# Patient Record
Sex: Female | Born: 1960 | Race: Black or African American | Hispanic: Yes | Smoking: Never smoker
Health system: Southern US, Community
[De-identification: ages and names within clinical notes are randomized; demographics above are authoritative.]

## PROBLEM LIST (undated history)

## (undated) DIAGNOSIS — T8859XA Other complications of anesthesia, initial encounter: Secondary | ICD-10-CM

## (undated) HISTORY — PX: DILATION AND CURETTAGE OF UTERUS: SHX78

## (undated) HISTORY — PX: TONSILLECTOMY: SUR1361

## (undated) HISTORY — PX: ROTATOR CUFF REPAIR: SHX139

---

## 2019-10-11 ENCOUNTER — Other Ambulatory Visit: Payer: Self-pay | Admitting: Sports Medicine

## 2019-10-11 DIAGNOSIS — G8929 Other chronic pain: Secondary | ICD-10-CM

## 2019-10-11 DIAGNOSIS — M25561 Pain in right knee: Secondary | ICD-10-CM

## 2019-10-11 DIAGNOSIS — M2391 Unspecified internal derangement of right knee: Secondary | ICD-10-CM

## 2019-10-11 DIAGNOSIS — M76891 Other specified enthesopathies of right lower limb, excluding foot: Secondary | ICD-10-CM

## 2019-11-21 ENCOUNTER — Ambulatory Visit: Payer: 59

## 2020-01-09 ENCOUNTER — Encounter: Payer: Self-pay | Admitting: Cardiology

## 2020-01-09 ENCOUNTER — Ambulatory Visit (INDEPENDENT_AMBULATORY_CARE_PROVIDER_SITE_OTHER): Payer: 59 | Admitting: Cardiology

## 2020-01-09 ENCOUNTER — Ambulatory Visit (INDEPENDENT_AMBULATORY_CARE_PROVIDER_SITE_OTHER): Payer: 59

## 2020-01-09 ENCOUNTER — Other Ambulatory Visit: Payer: Self-pay

## 2020-01-09 VITALS — BP 112/82 | HR 63 | Ht 62.0 in | Wt 181.2 lb

## 2020-01-09 DIAGNOSIS — R42 Dizziness and giddiness: Secondary | ICD-10-CM

## 2020-01-09 DIAGNOSIS — I499 Cardiac arrhythmia, unspecified: Secondary | ICD-10-CM

## 2020-01-09 NOTE — Progress Notes (Signed)
Cardiology Office Note:    Date:  01/09/2020   ID:  Alexandra Mills, DOB [birth year], MRN [id number]  PCP:  Leanna Sato, MD  Cardiologist:  Debbe Odea, MD  Electrophysiologist:  None   Referring MD: [facility]*   Chief Complaint  Patient presents with  . New Patient (Initial Visit)    pt conern w/ bradycardia/ "feeling a pain in my heart"/ Dizziness. Meds verbally reviewed w/ pt.   Alexandra Mills is a 59 y.o. female who is being seen today for the evaluation of palpitations at the request of [facility], [facility] Darreld Mclean, MD  History of Present Illness:    Alexandra Mills is a 59 y.o. female with no significant past medical history who presents due to fluttering heart and dizziness.  Patient has noticed fluttering/rapid heart beats for some time now.  Symptoms usually last about 3 to 5 seconds.  Over the past months, symptoms seems to have worsened, now lasting over 15 seconds.  She also notes episodes of dizziness are related with heart fluttering.  Dizziness typically occur while patient is in a seated position.  Abrupt movements of her head such as bending down to tie her shoeblace do not cause or provoke symptoms.  She denies any history of heart disease.  Denies syncope.  History reviewed. No pertinent past medical history.  History reviewed. No pertinent surgical history.  Current Medications: Current Meds  Medication Sig  . acetaminophen (TYLENOL) 500 MG tablet Take 500 mg by mouth every 6 (six) hours as needed. Taking as needed  . Cholecalciferol 50 MCG (2000 UT) CAPS Take by mouth.  . Evening Primrose Oil 1000 MG CAPS Take by mouth.  Marland Kitchen ibuprofen (ADVIL) 200 MG tablet Take 200 mg by mouth every 6 (six) hours as needed. Taking as needed  . Misc Natural Products (TUMERSAID) TABS Take by mouth.  . Multiple Vitamin (MULTI-VITAMIN) tablet Take by mouth.  . Turmeric 400 MG CAPS      Allergies:   Mangifera indica, Aleve [naproxen], Codeine, and  Pineapple   Social History   Socioeconomic History  . Marital status: Single    Spouse name: Not on file  . Number of children: Not on file  . Years of education: Not on file  . Highest education level: Not on file  Occupational History  . Not on file  Tobacco Use  . Smoking status: Never Smoker  Substance and Sexual Activity  . Alcohol use: Never  . Drug use: Never  . Sexual activity: Not on file  Other Topics Concern  . Not on file  Social History Narrative  . Not on file   Social Determinants of Health   Financial Resource Strain:   . Difficulty of Paying Living Expenses: Not on file  Food Insecurity:   . Worried About Programme researcher, broadcasting/film/video in the Last Year: Not on file  . Ran Out of Food in the Last Year: Not on file  Transportation Needs:   . Lack of Transportation (Medical): Not on file  . Lack of Transportation (Non-Medical): Not on file  Physical Activity:   . Days of Exercise per Week: Not on file  . Minutes of Exercise per Session: Not on file  Stress:   . Feeling of Stress : Not on file  Social Connections:   . Frequency of Communication with Friends and Family: Not on file  . Frequency of Social Gatherings with Friends and Family: Not on file  . Attends Religious Services: Not  on file  . Active Member of Clubs or Organizations: Not on file  . Attends Banker Meetings: Not on file  . Marital Status: Not on file     Family History: The patient's family history includes Bradycardia in her mother; Heart attack in her maternal grandmother; Stroke in her maternal grandmother.  ROS:   Please see the history of present illness.     All other systems reviewed and are negative.  EKGs/Labs/Other Studies Reviewed:    The following studies were reviewed today:   EKG:  EKG is  ordered today.  The ekg ordered today demonstrates normal sinus rhythm, normal ECG.  Recent Labs: No results found for requested labs within last 8760 hours.  Recent Lipid  Panel No results found for: CHOL, TRIG, HDL, CHOLHDL, VLDL, LDLCALC, LDLDIRECT  Physical Exam:    VS:  BP 112/82 (BP Location: Right Arm, Patient Position: Sitting, Cuff Size: Normal)   Pulse 63   Ht 5\' 2"  (1.575 m)   Wt 181 lb 4 oz (82.2 kg)   SpO2 98%   BMI 33.15 kg/m     Wt Readings from Last 3 Encounters:  01/09/20 181 lb 4 oz (82.2 kg)     GEN:  Well nourished, well developed in no acute distress HEENT: Normal NECK: No JVD; No carotid bruits LYMPHATICS: No lymphadenopathy CARDIAC: RRR, no murmurs, rubs, gallops RESPIRATORY:  Clear to auscultation without rales, wheezing or rhonchi  ABDOMEN: Soft, non-tender, non-distended MUSCULOSKELETAL:  No edema; No deformity  SKIN: Warm and dry NEUROLOGIC:  Alert and oriented x 3 PSYCHIATRIC:  Normal affect   ASSESSMENT:    1. Irregular heart beat   2. Dizziness    PLAN:    In order of problems listed above:  1. Patient with symptoms of fluttering heart.  Atrial arrhythmias are more likely in the etiology.  We will evaluate with a cardiac monitor x2 weeks. 2. Her symptoms of dizziness are not positional and unrelated with fluttering heart symptoms.  Orthostatic vitals in the office today did not reveal any findings of orthostasis.  Suspect an inner ear/balance problem.  She can follow-up with PCP or ENT for further management of this.  Follow-up in 1 month after cardiac monitor.  This note was generated in part or whole with voice recognition software. Voice recognition is usually quite accurate but there are transcription errors that can and very often do occur. I apologize for any typographical errors that were not detected and corrected.  Medication Adjustments/Labs and Tests Ordered: Current medicines are reviewed at length with the patient today.  Concerns regarding medicines are outlined above.  Orders Placed This Encounter  Procedures  . LONG TERM MONITOR (3-14 DAYS)  . EKG 12-Lead   No orders of the defined types  were placed in this encounter.   Patient Instructions  Medication Instructions:  Your physician recommends that you continue on your current medications as directed. Please refer to the Current Medication list given to you today.  *If you need a refill on your cardiac medications before your next appointment, please call your pharmacy*  Lab Work: None ordered If you have labs (blood work) drawn today and your tests are completely normal, you will receive your results only by: 01/11/20 MyChart Message (if you have MyChart) OR . A paper copy in the mail If you have any lab test that is abnormal or we need to change your treatment, we will call you to review the results.  Testing/Procedures: Your physician  has recommended that you wear a zio monitor. Zio monitors are medical devices that record the heart's electrical activity. Doctors most often [place] these monitors to diagnose arrhythmias. Arrhythmias are problems with the speed or rhythm of the heartbeat. The monitor is a small, portable device. You can wear one while you do your normal daily activities. This is usually used to diagnose what is causing palpitations/syncope (passing out).    Follow-Up: At [facility], you and your health needs are our priority.  As part of our continuing mission to provide you with exceptional heart care, we have created designated Provider Care Teams.  These Care Teams include your primary Cardiologist (physician) and Advanced Practice Providers (APPs -  Physician Assistants and Nurse Practitioners) who all work together to provide you with the care you need, when you need it.  Your next appointment:   4 week(s)  The format for your next appointment:   In Person  Provider:    You may see Kate Sable, MD or one of the following Advanced Practice Providers on your designated Care Team:    Murray Hodgkins, NP  Christell Faith, PA-C  Marrianne Mood, PA-C   Other Instructions Your physician has  recommended that you wear a Zio monitor. This monitor is a medical device that records the heart's electrical activity. Doctors most often use these monitors to diagnose arrhythmias. Arrhythmias are problems with the speed or rhythm of the heartbeat. The monitor is a small device applied to your chest. You can wear one while you do your normal daily activities. While wearing this monitor if you have any symptoms to push the button and record what you felt. Once you have worn this monitor for the period of time provider prescribed (Usually 14 days), you will return the monitor device in the postage paid box. Once it is returned they will download the data collected and provide [place] with a report which the provider will then review and we will call you with those results. Important tips:  1. Avoid showering during the first 24 hours of wearing the monitor. 2. Avoid excessive sweating to help maximize wear time. 3. Do not submerge the device, no hot tubs, and no swimming pools. 4. Keep any lotions or oils away from the patch. 5. After 24 hours you may shower with the patch on. Take brief showers with your back facing the shower head.  6. Do not remove patch once it has been placed because that will interrupt data and decrease adhesive wear time. 7. Push the button when you have any symptoms and write down what you were feeling. 8. Once you have completed wearing your monitor, remove and place into box which has postage paid and place in your outgoing mailbox.  9. If for some reason you have misplaced your box then call our office and we can provide another box and/or mail it off for you.           Signed, Kate Sable, MD  01/09/2020 10:39 AM    [place]

## 2020-01-09 NOTE — Patient Instructions (Signed)
Medication Instructions:  Your physician recommends that you continue on your current medications as directed. Please refer to the Current Medication list given to you today.  *If you need a refill on your cardiac medications before your next appointment, please call your pharmacy*  Lab Work: None ordered If you have labs (blood work) drawn today and your tests are completely normal, you will receive your results only by: Marland Kitchen MyChart Message (if you have MyChart) OR . A paper copy in the mail If you have any lab test that is abnormal or we need to change your treatment, we will call you to review the results.  Testing/Procedures: Your physician has recommended that you wear a zio monitor. Zio monitors are medical devices that record the heart's electrical activity. Doctors most often [place] these monitors to diagnose arrhythmias. Arrhythmias are problems with the speed or rhythm of the heartbeat. The monitor is a small, portable device. You can wear one while you do your normal daily activities. This is usually used to diagnose what is causing palpitations/syncope (passing out).    Follow-Up: At [facility], you and your health needs are our priority.  As part of our continuing mission to provide you with exceptional heart care, we have created designated Provider Care Teams.  These Care Teams include your primary Cardiologist (physician) and Advanced Practice Providers (APPs -  Physician Assistants and Nurse Practitioners) who all work together to provide you with the care you need, when you need it.  Your next appointment:   4 week(s)  The format for your next appointment:   In Person  Provider:    You may see Debbe Odea, MD or one of the following Advanced Practice Providers on your designated Care Team:    Nicolasa Ducking, NP  Eula Listen, PA-C  Marisue Ivan, PA-C   Other Instructions Your physician has recommended that you wear a Zio monitor. This monitor is a  medical device that records the heart's electrical activity. Doctors most often use these monitors to diagnose arrhythmias. Arrhythmias are problems with the speed or rhythm of the heartbeat. The monitor is a small device applied to your chest. You can wear one while you do your normal daily activities. While wearing this monitor if you have any symptoms to push the button and record what you felt. Once you have worn this monitor for the period of time provider prescribed (Usually 14 days), you will return the monitor device in the postage paid box. Once it is returned they will download the data collected and provide [place] with a report which the provider will then review and we will call you with those results. Important tips:  1. Avoid showering during the first 24 hours of wearing the monitor. 2. Avoid excessive sweating to help maximize wear time. 3. Do not submerge the device, no hot tubs, and no swimming pools. 4. Keep any lotions or oils away from the patch. 5. After 24 hours you may shower with the patch on. Take brief showers with your back facing the shower head.  6. Do not remove patch once it has been placed because that will interrupt data and decrease adhesive wear time. 7. Push the button when you have any symptoms and write down what you were feeling. 8. Once you have completed wearing your monitor, remove and place into box which has postage paid and place in your outgoing mailbox.  9. If for some reason you have misplaced your box then call our office and we  can provide another box and/or mail it off for you.        

## 2020-02-06 ENCOUNTER — Ambulatory Visit: Payer: 59 | Attending: Internal Medicine

## 2020-02-06 DIAGNOSIS — Z23 Encounter for immunization: Secondary | ICD-10-CM

## 2020-02-06 NOTE — Progress Notes (Signed)
   Covid-19 Vaccination Clinic  Name:  Alexandra Mills    MRN: [id number] DOB: 1960-12-08  02/06/2020  [name] was observed post Covid-19 immunization for 30 minutes based on pre-vaccination screening without incident. She was provided with Vaccine Information Sheet and instruction to access the V-Safe system.   [name] was instructed to call 911 with any severe reactions post vaccine: Marland Kitchen Difficulty breathing  . Swelling of face and throat  . A fast heartbeat  . A bad rash all over body  . Dizziness and weakness   Immunizations Administered    Name Date Dose VIS Date Route   Pfizer COVID-19 Vaccine 02/06/2020 10:03 AM 0.3 mL 10/28/2019 Intramuscular   Manufacturer: [employer], [employer]   Lot: [id number]   NDC: [address]-1

## 2020-02-16 ENCOUNTER — Other Ambulatory Visit: Payer: Self-pay

## 2020-02-16 ENCOUNTER — Ambulatory Visit (INDEPENDENT_AMBULATORY_CARE_PROVIDER_SITE_OTHER): Payer: 59 | Admitting: Cardiology

## 2020-02-16 ENCOUNTER — Encounter: Payer: Self-pay | Admitting: Cardiology

## 2020-02-16 VITALS — BP 120/80 | HR 70 | Ht 62.5 in | Wt 183.1 lb

## 2020-02-16 DIAGNOSIS — I471 Supraventricular tachycardia: Secondary | ICD-10-CM | POA: Diagnosis not present

## 2020-02-16 DIAGNOSIS — I499 Cardiac arrhythmia, unspecified: Secondary | ICD-10-CM

## 2020-02-16 DIAGNOSIS — I491 Atrial premature depolarization: Secondary | ICD-10-CM

## 2020-02-16 NOTE — Patient Instructions (Signed)
Medication Instructions:  Your physician recommends that you continue on your current medications as directed. Please refer to the Current Medication list given to you today.  *If you need a refill on your cardiac medications before your next appointment, please call your pharmacy*   Lab Work: none If you have labs (blood work) drawn today and your tests are completely normal, you will receive your results only by: Marland Kitchen MyChart Message (if you have MyChart) OR . A paper copy in the mail If you have any lab test that is abnormal or we need to change your treatment, we will call you to review the results.   Testing/Procedures: none   Follow-Up: At [facility], you and your health needs are our priority.  As part of our continuing mission to provide you with exceptional heart care, we have created designated Provider Care Teams.  These Care Teams include your primary Cardiologist (physician) and Advanced Practice Providers (APPs -  Physician Assistants and Nurse Practitioners) who all work together to provide you with the care you need, when you need it.  We recommend signing up for the patient portal called "MyChart".  Sign up information is provided on this After Visit Summary.  MyChart is used to connect with patients for Virtual Visits (Telemedicine).  Patients are able to view lab/test results, encounter notes, upcoming appointments, etc.  Non-urgent messages can be sent to your provider as well.   To learn more about what you can do with MyChart, go to [email or web address].    Your next appointment:   6 month(s)  The format for your next appointment:   In Person  Provider:    You may see Debbe Odea, MD or one of the following Advanced Practice Providers on your designated Care Team:    Nicolasa Ducking, NP  Eula Listen, PA-C  Marisue Ivan, PA-C

## 2020-02-16 NOTE — Progress Notes (Signed)
Cardiology Office Note:    Date:  02/16/2020   ID:  Alexandra Mills, DOB [birth year], MRN [id number]  PCP:  Leanna Sato, MD  Cardiologist:  Debbe Odea, MD  Electrophysiologist:  None   Referring MD: Leanna Sato, MD   Chief Complaint  Patient presents with  . other    4 week follow up & discuss Zio report. Meds reviewed by the pt. verbally. Pt. c/o palpitations and chest pain at times.      History of Present Illness:    Alexandra Mills is a 59 y.o. female with no significant past medical history who presents for follow-up.  She was last seen due to worsening heart fluttering typically lasting 3 to 5 seconds and associated with dizziness.  A cardiac monitor was obtained to evaluate any significant arrhythmias.  Dizziness was not deemed to be secondary to cardiac etiology.  Last orthostatic vital signs do not reveal any evidence for orthostasis.   History reviewed. No pertinent past medical history.  History reviewed. No pertinent surgical history.  Current Medications: Current Meds  Medication Sig  . acetaminophen (TYLENOL) 500 MG tablet Take 500 mg by mouth every 6 (six) hours as needed. Taking as needed  . Cholecalciferol 50 MCG (2000 UT) CAPS Take by mouth.  . Evening Primrose Oil 1000 MG CAPS Take by mouth.  Marland Kitchen ibuprofen (ADVIL) 200 MG tablet Take 200 mg by mouth every 6 (six) hours as needed. Taking as needed  . Misc Natural Products (TUMERSAID) TABS Take by mouth.  . Multiple Vitamin (MULTI-VITAMIN) tablet Take by mouth.  . Turmeric 400 MG CAPS      Allergies:   Mangifera indica, Aleve [naproxen], Codeine, and Pineapple   Social History   Socioeconomic History  . Marital status: Single    Spouse name: Not on file  . Number of children: Not on file  . Years of education: Not on file  . Highest education level: Not on file  Occupational History  . Not on file  Tobacco Use  . Smoking status: Never Smoker  . Smokeless tobacco: Never Used  Substance and  Sexual Activity  . Alcohol use: Never  . Drug use: Never  . Sexual activity: Not on file  Other Topics Concern  . Not on file  Social History Narrative  . Not on file   Social Determinants of Health   Financial Resource Strain:   . Difficulty of Paying Living Expenses:   Food Insecurity:   . Worried About Programme researcher, broadcasting/film/video in the Last Year:   . Barista in the Last Year:   Transportation Needs:   . Freight forwarder (Medical):   Marland Kitchen Lack of Transportation (Non-Medical):   Physical Activity:   . Days of Exercise per Week:   . Minutes of Exercise per Session:   Stress:   . Feeling of Stress :   Social Connections:   . Frequency of Communication with Friends and Family:   . Frequency of Social Gatherings with Friends and Family:   . Attends Religious Services:   . Active Member of Clubs or Organizations:   . Attends Banker Meetings:   Marland Kitchen Marital Status:      Family History: The patient's family history includes Bradycardia in her mother; Heart attack in her maternal grandmother; Stroke in her maternal grandmother.  ROS:   Please see the history of present illness.     All other systems reviewed and are negative.  EKGs/Labs/Other Studies  Reviewed:    The following studies were reviewed today:   EKG:  EKG is  ordered today.  The ekg ordered today demonstrates normal sinus rhythm, normal ECG.  Recent Labs: No results found for requested labs within last 8760 hours.  Recent Lipid Panel No results found for: CHOL, TRIG, HDL, CHOLHDL, VLDL, LDLCALC, LDLDIRECT  Physical Exam:    VS:  BP 120/80 (BP Location: Left Arm, Patient Position: Sitting, Cuff Size: Normal)   Pulse 70   Ht 5' 2.5" (1.588 m)   Wt 183 lb 2 oz (83.1 kg)   SpO2 98%   BMI 32.96 kg/m     Wt Readings from Last 3 Encounters:  02/16/20 183 lb 2 oz (83.1 kg)  01/09/20 181 lb 4 oz (82.2 kg)     GEN:  Well nourished, well developed in no acute distress HEENT: Normal NECK:  No JVD; No carotid bruits LYMPHATICS: No lymphadenopathy CARDIAC: RRR, no murmurs, rubs, gallops RESPIRATORY:  Clear to auscultation without rales, wheezing or rhonchi  ABDOMEN: Soft, non-tender, non-distended MUSCULOSKELETAL:  No edema; No deformity  SKIN: Warm and dry NEUROLOGIC:  Alert and oriented x 3 PSYCHIATRIC:  Normal affect   ASSESSMENT:    1. Paroxysmal SVT (supraventricular tachycardia) ([place])   2. Irregular heart beat   3. PAC (premature atrial contraction)    PLAN:     Patient with history of irregular heartbeat/fluttering heart.  2-week cardiac monitor showed frequent PACs,  episodes of nonsustained SVT runs of about 4 beats associated with patient triggered events.  No other significant arrhythmias were noted.  Patient made aware and trial of beta-blocker discussed with patient.  Patient will like to wait and see sayings she does not have any worrisome symptoms of dizziness and occasional skipped beats are not really bothersome.  If symptoms get worse we will consider a trial of beta-blockers.   Follow-up in 6 months  This note was generated in part or whole with voice recognition software. Voice recognition is usually quite accurate but there are transcription errors that can and very often do occur. I apologize for any typographical errors that were not detected and corrected.  Medication Adjustments/Labs and Tests Ordered: Current medicines are reviewed at length with the patient today.  Concerns regarding medicines are outlined above.  Orders Placed This Encounter  Procedures  . EKG 12-Lead   No orders of the defined types were placed in this encounter.   Patient Instructions  Medication Instructions:  Your physician recommends that you continue on your current medications as directed. Please refer to the Current Medication list given to you today.  *If you need a refill on your cardiac medications before your next appointment, please call your  pharmacy*   Lab Work: none If you have labs (blood work) drawn today and your tests are completely normal, you will receive your results only by: Marland Kitchen MyChart Message (if you have MyChart) OR . A paper copy in the mail If you have any lab test that is abnormal or we need to change your treatment, we will call you to review the results.   Testing/Procedures: none   Follow-Up: At [facility], you and your health needs are our priority.  As part of our continuing mission to provide you with exceptional heart care, we have created designated Provider Care Teams.  These Care Teams include your primary Cardiologist (physician) and Advanced Practice Providers (APPs -  Physician Assistants and Nurse Practitioners) who all work together to provide you with the  care you need, when you need it.  We recommend signing up for the patient portal called "MyChart".  Sign up information is provided on this After Visit Summary.  MyChart is used to connect with patients for Virtual Visits (Telemedicine).  Patients are able to view lab/test results, encounter notes, upcoming appointments, etc.  Non-urgent messages can be sent to your provider as well.   To learn more about what you can do with MyChart, go to [email or web address].    Your next appointment:   6 month(s)  The format for your next appointment:   In Person  Provider:    You may see Debbe Odea, MD or one of the following Advanced Practice Providers on your designated Care Team:    Nicolasa Ducking, NP  Eula Listen, PA-C  Marisue Ivan, PA-C     Signed, Debbe Odea, MD  02/16/2020 12:18 PM    [facility]

## 2020-02-27 ENCOUNTER — Ambulatory Visit: Payer: 59 | Attending: Internal Medicine

## 2020-02-27 DIAGNOSIS — Z23 Encounter for immunization: Secondary | ICD-10-CM

## 2020-02-27 NOTE — Progress Notes (Signed)
   Covid-19 Vaccination Clinic  Name:  Floyce Bujak    MRN: [id number] DOB: Feb 11, 1961  02/27/2020  Ms. Camire was observed post Covid-19 immunization for 30 minutes based on pre-vaccination screening without incident. She was provided with Vaccine Information Sheet and instruction to access the V-Safe system.   Ms. Zamor was instructed to call 911 with any severe reactions post vaccine: Marland Kitchen Difficulty breathing  . Swelling of face and throat  . A fast heartbeat  . A bad rash all over body  . Dizziness and weakness   Immunizations Administered    Name Date Dose VIS Date Route   Pfizer COVID-19 Vaccine 02/27/2020  9:03 AM 0.3 mL 10/28/2019 Intramuscular   Manufacturer: [employer], [employer]   Lot: [id number]   NDC: [address]-4

## 2020-07-30 ENCOUNTER — Other Ambulatory Visit: Payer: Self-pay | Admitting: Physician Assistant

## 2020-07-30 DIAGNOSIS — R439 Unspecified disturbances of smell and taste: Secondary | ICD-10-CM

## 2020-07-30 DIAGNOSIS — G8929 Other chronic pain: Secondary | ICD-10-CM

## 2020-08-16 ENCOUNTER — Ambulatory Visit: Payer: 59

## 2020-08-17 ENCOUNTER — Ambulatory Visit: Payer: 59 | Admitting: Cardiology

## 2020-09-06 ENCOUNTER — Ambulatory Visit: Payer: 59 | Admitting: Cardiology

## 2020-09-06 ENCOUNTER — Encounter: Payer: Self-pay | Admitting: Cardiology

## 2020-09-06 ENCOUNTER — Other Ambulatory Visit: Payer: Self-pay

## 2020-09-06 VITALS — BP 110/76 | HR 69 | Ht 62.0 in | Wt 180.4 lb

## 2020-09-06 DIAGNOSIS — I471 Supraventricular tachycardia: Secondary | ICD-10-CM

## 2020-09-06 NOTE — Progress Notes (Signed)
Cardiology Office Note:    Date:  09/06/2020   ID:  [name], DOB 1961-03-16, MRN [id number]  PCP:  Leanna Sato, MD  Cardiologist:  Debbe Odea, MD  Electrophysiologist:  None   Referring MD: Leanna Sato, MD   Chief Complaint  Patient presents with  . OTHER    6 month f/u no complaints today. Meds reviewed verbally with pt.     History of Present Illness:    Alexandra Mills is a 59 y.o. female with history of paroxysmal SVT who presents for follow-up.  Has a history of fluttering and dizziness.  Cardiac monitor showed paroxysmal SVT.  Beta-blocker trial was discussed with patient but she wanted to monitor symptoms off medications.  She has rare symptoms of palpitations maybe once a month, does not affect her activities of daily living.  She feels well otherwise, exercises frequently without any adverse effects.   History reviewed. No pertinent past medical history.  History reviewed. No pertinent surgical history.  Current Medications: Current Meds  Medication Sig  . acetaminophen (TYLENOL) 500 MG tablet Take 500 mg by mouth every 6 (six) hours as needed. Taking as needed  . Ascorbic Acid (VITAMIN C PO) Take by mouth daily.  . B Complex-C (SUPER B-C PO) Take by mouth daily.  . Cholecalciferol 50 MCG (2000 UT) CAPS Take by mouth.  . Evening Primrose Oil 1000 MG CAPS Take by mouth.  Marland Kitchen ibuprofen (ADVIL) 200 MG tablet Take 200 mg by mouth every 6 (six) hours as needed. Taking as needed  . Misc Natural Products (TUMERSAID) TABS Take by mouth.  . Multiple Vitamin (MULTI-VITAMIN) tablet Take by mouth.  . Turmeric 400 MG CAPS      Allergies:   Mangifera indica, Aleve [naproxen], Codeine, and Pineapple   Social History   Socioeconomic History  . Marital status: Single    Spouse name: Not on file  . Number of children: Not on file  . Years of education: Not on file  . Highest education level: Not on file  Occupational History  . Not on file  Tobacco Use  .  Smoking status: Never Smoker  . Smokeless tobacco: Never Used  Vaping Use  . Vaping Use: Never used  Substance and Sexual Activity  . Alcohol use: Never  . Drug use: Never  . Sexual activity: Not on file  Other Topics Concern  . Not on file  Social History Narrative  . Not on file   Social Determinants of Health   Financial Resource Strain:   . Difficulty of Paying Living Expenses: Not on file  Food Insecurity:   . Worried About Programme researcher, broadcasting/film/video in the Last Year: Not on file  . Ran Out of Food in the Last Year: Not on file  Transportation Needs:   . Lack of Transportation (Medical): Not on file  . Lack of Transportation (Non-Medical): Not on file  Physical Activity:   . Days of Exercise per Week: Not on file  . Minutes of Exercise per Session: Not on file  Stress:   . Feeling of Stress : Not on file  Social Connections:   . Frequency of Communication with Friends and Family: Not on file  . Frequency of Social Gatherings with Friends and Family: Not on file  . Attends Religious Services: Not on file  . Active Member of Clubs or Organizations: Not on file  . Attends Banker Meetings: Not on file  . Marital Status: Not on file  Family History: The patient's family history includes Bradycardia in her mother; Heart attack in her maternal grandmother; Stroke in her maternal grandmother.  ROS:   Please see the history of present illness.     All other systems reviewed and are negative.  EKGs/Labs/Other Studies Reviewed:    The following studies were reviewed today:   EKG:  EKG is  ordered today.  The ekg ordered today demonstrates normal sinus rhythm, sinus arrhythmia.  Recent Labs: No results found for requested labs within last 8760 hours.  Recent Lipid Panel No results found for: CHOL, TRIG, HDL, CHOLHDL, VLDL, LDLCALC, LDLDIRECT  Physical Exam:    VS:  BP 110/76 (BP Location: Left Arm, Patient Position: Sitting, Cuff Size: Normal)   Pulse 69    Ht 5\' 2"  (1.575 m)   Wt 180 lb 6 oz (81.8 kg)   SpO2 98%   BMI 32.99 kg/m     Wt Readings from Last 3 Encounters:  09/06/20 180 lb 6 oz (81.8 kg)  02/16/20 183 lb 2 oz (83.1 kg)  01/09/20 181 lb 4 oz (82.2 kg)     GEN:  Well nourished, well developed in no acute distress HEENT: Normal NECK: No JVD; No carotid bruits LYMPHATICS: No lymphadenopathy CARDIAC: RRR, no murmurs, rubs, gallops RESPIRATORY:  Clear to auscultation without rales, wheezing or rhonchi  ABDOMEN: Soft, non-tender, non-distended MUSCULOSKELETAL:  No edema; No deformity  SKIN: Warm and dry NEUROLOGIC:  Alert and oriented x 3 PSYCHIATRIC:  Normal affect   ASSESSMENT:    1. Paroxysmal SVT (supraventricular tachycardia) (HCC)    PLAN:     Patient with history of irregular heartbeat/fluttering heart.  Cardiac monitor showed occasional rare nonsustained SVT.  She has rare episodes of palpitations about once a month, no dizziness, no syncope.  Otherwise feels well.  Patient advised to monitor symptoms.  Will let 01/11/20 know if symptoms become more frequent or worsen.  Will consider trial of beta-blockers at that point.  Follow-up as needed.  This note was generated in part or whole with voice recognition software. Voice recognition is usually quite accurate but there are transcription errors that can and very often do occur. I apologize for any typographical errors that were not detected and corrected.  Medication Adjustments/Labs and Tests Ordered: Current medicines are reviewed at length with the patient today.  Concerns regarding medicines are outlined above.  Orders Placed This Encounter  Procedures  . EKG 12-Lead   No orders of the defined types were placed in this encounter.   Patient Instructions  Medication Instructions:  Your physician recommends that you continue on your current medications as directed. Please refer to the Current Medication list given to you today.  *If you need a refill on your  cardiac medications before your next appointment, please call your pharmacy*  Follow-Up: At [facility], you and your health needs are our priority.  As part of our continuing mission to provide you with exceptional heart care, we have created designated Provider Care Teams.  These Care Teams include your primary Cardiologist (physician) and Advanced Practice Providers (APPs -  Physician Assistants and Nurse Practitioners) who all work together to provide you with the care you need, when you need it.  We recommend signing up for the patient portal called "MyChart".  Sign up information is provided on this After Visit Summary.  MyChart is used to connect with patients for Virtual Visits (Telemedicine).  Patients are able to view lab/test results, encounter notes, upcoming appointments, etc.  Non-urgent messages can be sent to your provider as well.   To learn more about what you can do with MyChart, go to [email or web address].    Your next appointment:   As needed.      Signed, Debbe Odea, MD  09/06/2020 12:57 PM    [facility]

## 2020-09-06 NOTE — Patient Instructions (Signed)

## 2020-11-30 ENCOUNTER — Other Ambulatory Visit: Payer: Self-pay | Admitting: Student

## 2020-11-30 DIAGNOSIS — M25561 Pain in right knee: Secondary | ICD-10-CM

## 2020-11-30 DIAGNOSIS — M2391 Unspecified internal derangement of right knee: Secondary | ICD-10-CM

## 2020-12-04 ENCOUNTER — Other Ambulatory Visit: Payer: Self-pay | Admitting: Student

## 2020-12-04 DIAGNOSIS — M7582 Other shoulder lesions, left shoulder: Secondary | ICD-10-CM

## 2020-12-04 DIAGNOSIS — M7522 Bicipital tendinitis, left shoulder: Secondary | ICD-10-CM

## 2020-12-08 ENCOUNTER — Ambulatory Visit: Payer: 59

## 2020-12-11 ENCOUNTER — Ambulatory Visit
Admission: RE | Admit: 2020-12-11 | Discharge: 2020-12-11 | Disposition: A | Discharge status: Home / Self Care | Payer: 59 | Source: Ambulatory Visit | Attending: Student | Admitting: Student

## 2020-12-11 ENCOUNTER — Other Ambulatory Visit: Payer: Self-pay

## 2020-12-11 DIAGNOSIS — M7582 Other shoulder lesions, left shoulder: Secondary | ICD-10-CM | POA: Diagnosis present

## 2020-12-11 DIAGNOSIS — M7522 Bicipital tendinitis, left shoulder: Secondary | ICD-10-CM | POA: Diagnosis present

## 2021-02-15 ENCOUNTER — Other Ambulatory Visit: Payer: Self-pay | Admitting: Student

## 2021-02-15 DIAGNOSIS — M2391 Unspecified internal derangement of right knee: Secondary | ICD-10-CM

## 2021-02-15 DIAGNOSIS — M25561 Pain in right knee: Secondary | ICD-10-CM

## 2021-02-22 ENCOUNTER — Ambulatory Visit
Admission: RE | Admit: 2021-02-22 | Discharge: 2021-02-22 | Disposition: A | Discharge status: Home / Self Care | Payer: 59 | Source: Ambulatory Visit | Attending: Student | Admitting: Student

## 2021-02-22 ENCOUNTER — Other Ambulatory Visit: Payer: Self-pay

## 2021-02-22 DIAGNOSIS — M25561 Pain in right knee: Secondary | ICD-10-CM | POA: Insufficient documentation

## 2021-02-22 DIAGNOSIS — M2391 Unspecified internal derangement of right knee: Secondary | ICD-10-CM | POA: Diagnosis present

## 2021-03-08 ENCOUNTER — Other Ambulatory Visit: Payer: Self-pay | Admitting: Surgery

## 2021-03-13 ENCOUNTER — Encounter
Admission: RE | Admit: 2021-03-13 | Discharge: 2021-03-13 | Disposition: A | Discharge status: Home / Self Care | Payer: 59 | Source: Ambulatory Visit | Attending: Surgery | Admitting: Surgery

## 2021-03-13 ENCOUNTER — Other Ambulatory Visit: Payer: Self-pay

## 2021-03-13 HISTORY — DX: Other complications of anesthesia, initial encounter: T88.59XA

## 2021-03-13 NOTE — Patient Instructions (Signed)
Your procedure is scheduled on: Mar 19, 2021 Tuesday  Report to the Registration Desk on the 1st floor of the [facility]. To find out your arrival time, please call [phone] between 1PM - 3PM on: Mar 18, 2021 MONDAY  REMEMBER: Instructions that are not followed completely may result in serious medical risk, up to and including death; or upon the discretion of your surgeon and anesthesiologist your surgery may need to be rescheduled.  Do not eat food after midnight the night before surgery.  No gum chewing, lozengers or hard candies.  You may however, drink CLEAR liquids up to 2 hours before you are scheduled to arrive for your surgery. Do not drink anything within 2 hours of your scheduled arrival time.  Clear liquids include: - water  - apple juice without pulp - gatorade (not RED, PURPLE, OR BLUE) - black coffee or tea (Do NOT add milk or creamers to the coffee or tea) Do NOT drink anything that is not on this list.  Type 1 and Type 2 diabetics should only drink water.  In addition, your doctor has ordered for you to drink the provided  Ensure Pre-Surgery Clear Carbohydrate Drink  Drinking this carbohydrate drink up to two hours before surgery helps to reduce insulin resistance and improve patient outcomes. Please complete drinking 2 hours prior to scheduled arrival time.  TAKE THESE MEDICATIONS THE MORNING OF SURGERY WITH A SIP OF WATER: NONE  One week prior to surgery: Stop Anti-inflammatories (NSAIDS) such as Advil, Aleve, Ibuprofen, Motrin, Naproxen, Naprosyn and ASPIRIN OR Aspirin based products such as Excedrin, Goodys Powder, BC Powder.  USE TYLENOL IF NEEDED FOR PAIN  Stop ANY OVER THE COUNTER supplements until after surgery.  No Alcohol for 24 hours before or after surgery.  No Smoking including e-cigarettes for 24 hours prior to surgery.  No chewable tobacco products for at least 6 hours prior to surgery.  No nicotine patches on the day of surgery.  Do not use  any "recreational" drugs for at least a week prior to your surgery.  Please be advised that the combination of cocaine and anesthesia may have negative outcomes, up to and including death. If you test positive for cocaine, your surgery will be cancelled.  On the morning of surgery brush your teeth with toothpaste and water, you may rinse your mouth with mouthwash if you wish. Do not swallow any toothpaste or mouthwash.  Do not wear jewelry, make-up, hairpins, clips or nail polish.  Do not wear lotions, powders, or perfumes.   Do not shave body from the neck down 48 hours prior to surgery just in case you cut yourself which could leave a site for infection.  Also, freshly shaved skin may become irritated if using the CHG soap.  Contact lenses, hearing aids and dentures may not be worn into surgery.  Do not bring valuables to the hospital. [facility] is not responsible for any missing/lost belongings or valuables.   Use CHG Soap as directed on instruction sheet.  Notify your doctor if there is any change in your medical condition (cold, fever, infection).  Wear comfortable clothing (specific to your surgery type) to the hospital.  Plan for stool softeners for home use; pain medications have a tendency to cause constipation. You can also help prevent constipation by eating foods high in fiber such as fruits and vegetables and drinking plenty of fluids as your diet allows.  After surgery, you can help prevent lung complications by doing breathing exercises.  Take deep breaths and cough every 1-2 hours. Your doctor may order a device called an Incentive Spirometer to help you take deep breaths. When coughing or sneezing, hold a pillow firmly against your incision with both hands. This is called "splinting." Doing this helps protect your incision. It also decreases belly discomfort.  If you are being discharged the day of surgery, you will not be allowed to drive home. You will need a  responsible adult (18 years or older) to drive you home and stay with you that night.   If you are taking public transportation, you will need to have a responsible adult (18 years or older) with you. Please confirm with your physician that it is acceptable to use public transportation.   Please call the Pre-admissions Testing Dept. at [phone] if you have any questions about these instructions.  Surgery Visitation Policy:  Patients undergoing a surgery or procedure may have one OR TWO family member or support person with them as long as that person is not COVID-19 positive or experiencing its symptoms.  That person may remain in the waiting area during the procedure.  Inpatient Visitation:    Visiting hours are 7 a.m. to 8 p.m. Inpatients will be allowed two visitors daily. The visitors may change each day during the patient's stay. No visitors under the age of 35. Any visitor under the age of 22 must be accompanied by an adult. The visitor must pass COVID-19 screenings, use hand sanitizer when entering and exiting the patient's room and wear a mask at all times, including in the patient's room. Patients must also wear a mask when staff or their visitor are in the room. Masking is required regardless of vaccination status.

## 2021-03-15 ENCOUNTER — Other Ambulatory Visit: Payer: Self-pay

## 2021-03-15 ENCOUNTER — Other Ambulatory Visit
Admission: RE | Admit: 2021-03-15 | Discharge: 2021-03-15 | Disposition: A | Discharge status: Home / Self Care | Payer: 59 | Source: Ambulatory Visit | Attending: Surgery | Admitting: Surgery

## 2021-03-15 ENCOUNTER — Other Ambulatory Visit: Payer: 59

## 2021-03-15 DIAGNOSIS — Z01812 Encounter for preprocedural laboratory examination: Secondary | ICD-10-CM | POA: Insufficient documentation

## 2021-03-15 DIAGNOSIS — Z20822 Contact with and (suspected) exposure to covid-19: Secondary | ICD-10-CM | POA: Diagnosis not present

## 2021-03-16 LAB — SARS CORONAVIRUS 2 (TAT 6-24 HRS): SARS Coronavirus 2: NEGATIVE

## 2021-03-19 ENCOUNTER — Encounter: Payer: Self-pay | Admitting: Surgery

## 2021-03-19 ENCOUNTER — Ambulatory Visit
Admission: RE | Admit: 2021-03-19 | Discharge: 2021-03-19 | Disposition: A | Discharge status: Home / Self Care | Payer: 59 | Source: Ambulatory Visit | Attending: Surgery | Admitting: Surgery

## 2021-03-19 ENCOUNTER — Ambulatory Visit: Payer: 59 | Admitting: Certified Registered"

## 2021-03-19 ENCOUNTER — Encounter
Admission: RE | Disposition: A | Discharge status: Home / Self Care | Payer: Self-pay | Source: Ambulatory Visit | Attending: Surgery

## 2021-03-19 DIAGNOSIS — S83231A Complex tear of medial meniscus, current injury, right knee, initial encounter: Secondary | ICD-10-CM | POA: Insufficient documentation

## 2021-03-19 DIAGNOSIS — M1711 Unilateral primary osteoarthritis, right knee: Secondary | ICD-10-CM | POA: Diagnosis not present

## 2021-03-19 DIAGNOSIS — X58XXXA Exposure to other specified factors, initial encounter: Secondary | ICD-10-CM | POA: Diagnosis not present

## 2021-03-19 DIAGNOSIS — Z8249 Family history of ischemic heart disease and other diseases of the circulatory system: Secondary | ICD-10-CM | POA: Insufficient documentation

## 2021-03-19 DIAGNOSIS — Z885 Allergy status to narcotic agent status: Secondary | ICD-10-CM | POA: Insufficient documentation

## 2021-03-19 HISTORY — PX: KNEE ARTHROSCOPY: SHX127

## 2021-03-19 SURGERY — ARTHROSCOPY, KNEE
Anesthesia: General | Site: Knee | Laterality: Right

## 2021-03-19 MED ORDER — BUPIVACAINE-EPINEPHRINE (PF) 0.5% -1:200000 IJ SOLN
INTRAMUSCULAR | Status: AC
  Filled 2021-03-19: qty 60

## 2021-03-19 MED ORDER — GLYCOPYRROLATE 0.2 MG/ML IJ SOLN
INTRAMUSCULAR | Status: DC | PRN
  Administered 2021-03-19: .2 mg via INTRAVENOUS

## 2021-03-19 MED ORDER — DEXAMETHASONE SODIUM PHOSPHATE 10 MG/ML IJ SOLN
INTRAMUSCULAR | Status: AC
  Filled 2021-03-19: qty 1

## 2021-03-19 MED ORDER — BUPIVACAINE-EPINEPHRINE (PF) 0.5% -1:200000 IJ SOLN
INTRAMUSCULAR | Status: DC | PRN
  Administered 2021-03-19: 30 mL via PERINEURAL
  Administered 2021-03-19: 20 mL

## 2021-03-19 MED ORDER — DEXAMETHASONE SODIUM PHOSPHATE 10 MG/ML IJ SOLN
INTRAMUSCULAR | Status: DC | PRN
  Administered 2021-03-19: 10 mg via INTRAVENOUS

## 2021-03-19 MED ORDER — MIDAZOLAM HCL 2 MG/2ML IJ SOLN
INTRAMUSCULAR | Status: DC | PRN
  Administered 2021-03-19: 2 mg via INTRAVENOUS

## 2021-03-19 MED ORDER — FENTANYL CITRATE (PF) 100 MCG/2ML IJ SOLN
INTRAMUSCULAR | Status: DC | PRN
  Administered 2021-03-19 (×4): 25 ug via INTRAVENOUS

## 2021-03-19 MED ORDER — LIDOCAINE HCL 1 % IJ SOLN
INTRAMUSCULAR | Status: DC | PRN
  Administered 2021-03-19: 30 mL

## 2021-03-19 MED ORDER — MIDAZOLAM HCL 2 MG/2ML IJ SOLN
INTRAMUSCULAR | Status: AC
  Filled 2021-03-19: qty 2

## 2021-03-19 MED ORDER — TRAMADOL HCL 50 MG PO TABS: 50.0000 mg | ORAL_TABLET | Freq: Four times a day (QID) | ORAL | 0 refills | Status: DC | PRN

## 2021-03-19 MED ORDER — PHENYLEPHRINE HCL (PRESSORS) 10 MG/ML IV SOLN
INTRAVENOUS | Status: DC | PRN
  Administered 2021-03-19: 200 ug via INTRAVENOUS
  Administered 2021-03-19: 50 ug via INTRAVENOUS

## 2021-03-19 MED ORDER — LIDOCAINE HCL (PF) 1 % IJ SOLN
INTRAMUSCULAR | Status: AC
  Filled 2021-03-19: qty 30

## 2021-03-19 MED ORDER — PROPOFOL 10 MG/ML IV BOLUS
INTRAVENOUS | Status: DC | PRN
  Administered 2021-03-19: 150 mg via INTRAVENOUS

## 2021-03-19 MED ORDER — CEFAZOLIN SODIUM-DEXTROSE 2-4 GM/100ML-% IV SOLN
INTRAVENOUS | Status: AC
  Filled 2021-03-19: qty 100

## 2021-03-19 MED ORDER — CHLORHEXIDINE GLUCONATE 0.12 % MT SOLN: 15.0000 mL | Freq: Once | OROMUCOSAL | Status: AC

## 2021-03-19 MED ORDER — LIDOCAINE HCL (CARDIAC) PF 100 MG/5ML IV SOSY
PREFILLED_SYRINGE | INTRAVENOUS | Status: DC | PRN
  Administered 2021-03-19: 100 mg via INTRAVENOUS

## 2021-03-19 MED ORDER — FAMOTIDINE 20 MG PO TABS: 20.0000 mg | ORAL_TABLET | Freq: Once | ORAL | Status: AC

## 2021-03-19 MED ORDER — CEFAZOLIN SODIUM-DEXTROSE 2-4 GM/100ML-% IV SOLN
2.0000 g | INTRAVENOUS | Status: AC
  Administered 2021-03-19: 2 g via INTRAVENOUS

## 2021-03-19 MED ORDER — LACTATED RINGERS IV SOLN: INTRAVENOUS | Status: DC

## 2021-03-19 MED ORDER — GLYCOPYRROLATE 0.2 MG/ML IJ SOLN
INTRAMUSCULAR | Status: AC
  Filled 2021-03-19: qty 1

## 2021-03-19 MED ORDER — ORAL CARE MOUTH RINSE: 15.0000 mL | Freq: Once | OROMUCOSAL | Status: AC

## 2021-03-19 MED ORDER — FAMOTIDINE 20 MG PO TABS
ORAL_TABLET | ORAL | Status: AC
  Administered 2021-03-19: 20 mg via ORAL
  Filled 2021-03-19: qty 1

## 2021-03-19 MED ORDER — FENTANYL CITRATE (PF) 100 MCG/2ML IJ SOLN
INTRAMUSCULAR | Status: AC
  Filled 2021-03-19: qty 2

## 2021-03-19 MED ORDER — ONDANSETRON HCL 4 MG/2ML IJ SOLN: 4.0000 mg | Freq: Once | INTRAMUSCULAR | Status: DC | PRN

## 2021-03-19 MED ORDER — FENTANYL CITRATE (PF) 100 MCG/2ML IJ SOLN: 25.0000 ug | INTRAMUSCULAR | Status: DC | PRN

## 2021-03-19 MED ORDER — CHLORHEXIDINE GLUCONATE 0.12 % MT SOLN
OROMUCOSAL | Status: AC
  Administered 2021-03-19: 15 mL via OROMUCOSAL
  Filled 2021-03-19: qty 15

## 2021-03-19 MED ORDER — ONDANSETRON HCL 4 MG/2ML IJ SOLN
INTRAMUSCULAR | Status: DC | PRN
  Administered 2021-03-19: 4 mg via INTRAVENOUS

## 2021-03-19 SURGICAL SUPPLY — 34 items
"PENCIL ELECTRO HAND CTR " (MISCELLANEOUS) ×1 IMPLANT
APL PRP STRL LF DISP 70% ISPRP (MISCELLANEOUS) ×1
BNDG ELASTIC 6X5.8 VLCR STR LF (GAUZE/BANDAGES/DRESSINGS) ×2 IMPLANT
CHLORAPREP W/TINT 26 (MISCELLANEOUS) ×2 IMPLANT
CUFF TOURN SGL QUICK 24 (TOURNIQUET CUFF)
CUFF TOURN SGL QUICK 30 (TOURNIQUET CUFF)
CUFF TRNQT CYL 24X4X16.5-23 (TOURNIQUET CUFF) IMPLANT
CUFF TRNQT CYL 30X4X21-28X (TOURNIQUET CUFF) IMPLANT
DRAPE IMP U-DRAPE 54X76 (DRAPES) ×2 IMPLANT
ELECT REM PT RETURN 9FT ADLT (ELECTROSURGICAL) ×2
ELECTRODE REM PT RTRN 9FT ADLT (ELECTROSURGICAL) ×1 IMPLANT
GAUZE SPONGE 4X4 12PLY STRL (GAUZE/BANDAGES/DRESSINGS) ×2 IMPLANT
GLOVE SURG ENC MOIS LTX SZ8 (GLOVE) ×4 IMPLANT
GLOVE SURG ENC TEXT LTX SZ7 (GLOVE) ×4 IMPLANT
GLOVE SURG UNDER LTX SZ8 (GLOVE) ×2 IMPLANT
GLOVE SURG UNDER POLY LF SZ7.5 (GLOVE) ×2 IMPLANT
GOWN STRL REUS W/ TWL LRG LVL3 (GOWN DISPOSABLE) ×1 IMPLANT
GOWN STRL REUS W/ TWL XL LVL3 (GOWN DISPOSABLE) ×2 IMPLANT
GOWN STRL REUS W/TWL LRG LVL3 (GOWN DISPOSABLE) ×2
GOWN STRL REUS W/TWL XL LVL3 (GOWN DISPOSABLE) ×4
IV LACTATED RINGER IRRG 3000ML (IV SOLUTION) ×2
IV LR IRRIG 3000ML ARTHROMATIC (IV SOLUTION) ×1 IMPLANT
KIT TURNOVER KIT A (KITS) ×2 IMPLANT
MANIFOLD NEPTUNE II (INSTRUMENTS) ×4 IMPLANT
NDL HYPO 21X1.5 SAFETY (NEEDLE) ×1 IMPLANT
NDL SAFETY ECLIPSE 18X1.5 (NEEDLE) IMPLANT
NEEDLE HYPO 18GX1.5 SHARP (NEEDLE) ×2
NEEDLE HYPO 21X1.5 SAFETY (NEEDLE) ×2 IMPLANT
PACK ARTHROSCOPY KNEE (MISCELLANEOUS) ×2 IMPLANT
PENCIL ELECTRO HAND CTR (MISCELLANEOUS) ×2 IMPLANT
SUT PROLENE 4 0 PS 2 18 (SUTURE) ×2 IMPLANT
SUT TICRON COATED BLUE 2 0 30 (SUTURE) IMPLANT
SYR 50ML LL SCALE MARK (SYRINGE) ×2 IMPLANT
WAND WEREWOLF FLOW 90D (MISCELLANEOUS) ×2 IMPLANT

## 2021-03-19 NOTE — H&P (Signed)
History of Present Illness: Alexandra Mills is a 60 y.o.female who has been referred by Horris Latino, PA-C, for right knee pain. The symptoms began several years ago but have worsened over the past several months. She denies any specific injury to the knee which may have precipitated the onset of her symptoms. She reports 7/10 pain. The pain is located along the medial aspect of the knee. The pain is described as aching, stabbing and throbbing. The symptoms are aggravated using stairs, at higher levels of activity, rising from a chair, walking, standing, standing pivot, exercising and activity in general. She also describes some stiffness/swelling in the knee with an occasional catching sensation. She has mild associated swelling and no deformity. She has tried acetaminophen, anti-inflammatories, steroid injections and ice with temporary partial relief.  Current Outpatient Medications: . ascorbic acid, vitamin C, (VITAMIN C) 500 MG tablet Take 500 mg by mouth once daily  . cholecalciferol (VITAMIN D3) 2,000 unit capsule Take 2,000 Units by mouth once daily  . eve prim-linoleic-gamolenic ac 1,000 mg Cap Take 1 capsule by mouth once daily  . ferrous fumarate/vit Bcomp,C (SUPER B COMPLEX ORAL) Take by mouth  . ibuprofen-acetaminophen (ADVIL DUAL ACTION) 125-250 mg Tab Take 2 tablets by mouth as needed  . multivitamin tablet Take 1 tablet by mouth once daily  . turm-ging-bos-yuc-wil-cham-hor 100-100-100-125 mg Tab Take by mouth   Allergies:  Marland Kitchen Mango Anaphylaxis  . Codeine Sulfate (Itching)  . Pineapple Other (Lip swelling)   Past Medical History:  . Bradycardia   Past Surgical History:  . Right rotator cuff repair - Right (2015 and 01/2015)   Family History  Problem Relation Age of Onset  . Bradycardia Mother  . No Known Problems Father   Social History:   Socioeconomic History:  Marland Kitchen Marital status: Widowed  Tobacco Use  . Smoking status: Never Smoker  . Smokeless tobacco: Never Used   Vaping Use  . Vaping Use: Never used  Substance and Sexual Activity  . Alcohol use: Not Currently  . Drug use: Never   Review of Systems:  A comprehensive 14 point ROS was performed, reviewed, and the pertinent orthopaedic findings are documented in the HPI.  Physical Exam: Vitals:  03/04/21 1037  BP: 128/82  Weight: 81.1 kg (178 lb 12.8 oz)  Height: 157.5 cm (5\' 2" )  PainSc: 7  PainLoc: Knee   General/Constitutional: The patient appears to be well-nourished, well-developed, and in no acute distress. Neuro/Psych: Normal mood and affect, oriented to person, place and time. Eyes: Non-icteric. Pupils are equal, round, and reactive to light, and exhibit synchronous movement. Lymphatic: No palpable adenopathy. Respiratory: Lungs clear to auscultation, Normal chest excursion, No wheezes and Non-labored breathing Cardiovascular: Regular rate and rhythm. No murmurs. and No edema, swelling or tenderness, except as noted in detailed exam. Vascular: No edema, swelling or tenderness, except as noted in detailed exam. Integumentary: No impressive skin lesions present, except as noted in detailed exam. Musculoskeletal: Unremarkable, except as noted in detailed exam.  Right knee exam: GAIT: Essentially normal gait and uses no assistive devices. ALIGNMENT: normal SKIN: unremarkable SWELLING: minimal EFFUSION: small WARMTH: no warmth TENDERNESS: moderate tenderness along medial joint line, mild tenderness along lateral joint line ROM: 0 to 130 degrees with pain in maximal flexion McMURRAY'S: positive PATELLOFEMORAL: normal tracking with no peri-patellar tenderness and negative apprehension sign CREPITUS: no LACHMAN'S: negative PIVOT SHIFT: negative ANTERIOR DRAWER: negative POSTERIOR DRAWER: negative VARUS/VALGUS: stable  She is neurovascularly intact to the right lower extremity and foot.  Knee Imaging, external: Right knee: A recent MRI scan of the right knee is available for  review. By report, this study demonstrates evidence of a posterior horn medial meniscus tear, as well as some degenerative changes of the patellofemoral compartment. Mild degenerative changes the medial compartment also are noted. The lateral compartment is well-maintained. No ligamentous pathology is identified. Both the films report were reviewed by myself and discussed with the patient.  Assessment:  1. Primary osteoarthritis of right knee.  2. Complex tear of medial meniscus of right knee.   Plan: The treatment options were discussed with the patient. In addition, patient educational materials were provided regarding the diagnosis and treatment options. The patient is quite frustrated by her symptoms and functional limitations, and is ready to consider more aggressive treatment options. Therefore, I have recommended a surgical procedure, specifically a right knee arthroscopy with debridement and repair versus partial medial meniscectomy. The procedure was discussed with the patient, as were the potential risks (including bleeding, infection, nerve and/or blood vessel injury, persistent or recurrent pain, failure of the repair, progression of arthritis, need for further surgery, blood clots, strokes, heart attacks and/or arhythmias, pneumonia, etc.) and benefits. The patient states her understanding and wishes to proceed. All of the patient's questions and concerns were answered. She can call any time with further concerns. She will return to work without restrictions. She will follow up post-surgery, routine.   H&P reviewed and patient re-examined. No changes.

## 2021-03-19 NOTE — Anesthesia Preprocedure Evaluation (Signed)
Anesthesia Evaluation  Patient identified by MRN, date of birth, ID band Patient awake    Reviewed: Allergy & Precautions, NPO status , Patient's Chart, lab work & pertinent test results  History of Anesthesia Complications Negative for: history of anesthetic complications  Airway Mallampati: II       Dental   Pulmonary neg sleep apnea, neg COPD, Not current smoker,           Cardiovascular (-) hypertension(-) Past MI and (-) CHF (-) dysrhythmias (-) Valvular Problems/Murmurs     Neuro/Psych neg Seizures    GI/Hepatic Neg liver ROS, neg GERD  ,  Endo/Other  neg diabetes  Renal/GU negative Renal ROS     Musculoskeletal   Abdominal   Peds  Hematology   Anesthesia Other Findings   Reproductive/Obstetrics                             Anesthesia Physical Anesthesia Plan  ASA: II  Anesthesia Plan: General   Post-op Pain Management:    Induction: Intravenous  PONV Risk Score and Plan: 3 and Ondansetron and Dexamethasone  Airway Management Planned: LMA  Additional Equipment:   Intra-op Plan:   Post-operative Plan:   Informed Consent: I have reviewed the patients History and Physical, chart, labs and discussed the procedure including the risks, benefits and alternatives for the proposed anesthesia with the patient or authorized representative who has indicated his/her understanding and acceptance.       Plan Discussed with:   Anesthesia Plan Comments:         Anesthesia Quick Evaluation

## 2021-03-19 NOTE — OR PostOp (Incomplete)
PACU TO INPATIENT HANDOFF REPORT  Name/Age/Gender Alexandra Mills 60 y.o. female  Code Status   Home/SNF/Other {Discharge Destination:18313::"Home"}  Chief Complaint Primary osteoarthritis of right knee M17.11 Complex tear of medial meniscus of right knee as current injury, subsequent encounter S83.231D  Level of Care/Admitting Diagnosis ED Disposition    None      Medical History Past Medical History:  Diagnosis Date  . Complication of anesthesia    DIFFICULT TO WAKE UP    Allergies Allergies  Allergen Reactions  . Mangifera Indica Anaphylaxis    MANGO   . Aleve [Naproxen] Itching  . Codeine Itching  . Pineapple Swelling    Lip swelling    IV Location/Drains/Wounds Patient Lines/Drains/Airways Status    Active Line/Drains/Airways    Name Placement date Placement time Site Days   Peripheral IV 03/19/21 Anterior;Left Wrist 03/19/21  1120  Wrist  less than 1   Incision (Closed) 03/19/21 Leg Right 03/19/21  1234  - less than 1          Labs/Imaging No results found for this or any previous visit (from the past 48 hour(s)). No results found.  Pending Labs   Vitals/Pain Today's Vitals   03/19/21 1300 03/19/21 1315 03/19/21 1330 03/19/21 1345  BP: 132/80 130/88 136/88 (!) 126/91  Pulse: 70 65 62 70  Resp: 11 13 10 18   Temp:    (!) 97.4 F (36.3 C)  TempSrc:      SpO2: 98% 100% 100% 100%  Weight:      Height:      PainSc:  0-No pain  0-No pain    Isolation Precautions @ISOLATION @  Administered Medications Periop Administered Meds from 03/19/2021 1046 to 03/19/2021 1352      Date/Time Order Dose Route Action Action by Comments    03/19/2021 1201 bupivacaine-epinephrine (MARCAINE W/ EPI) 0.5% -1:200000 injection 20 mL Infiltration Given 05/19/2021, MD     03/19/2021 1145 bupivacaine-epinephrine (MARCAINE W/ EPI) 0.5% -1:200000 injection 30 mL Peri-NEURAL Given Poggi, Christena Flake, MD     03/19/2021 1140 ceFAZolin (ANCEF) 2-4 GM/100ML-% IVPB     Override pull for Anesthesia Excell Seltzer, CRNA Filed by anesthesia medication administration from clinical order 05/19/2021    03/19/2021 1133 ceFAZolin (ANCEF) IVPB 2g/100 mL premix 2 g Intravenous Given [id number], CRNA     03/19/2021 1111 chlorhexidine (PERIDEX) 0.12 % solution 15 mL 15 mL Mouth/Throat Given Danelle Berry, RN     03/19/2021 1146 dexamethasone (DECADRON) injection 10 mg Intravenous Given Earlie Raveling, CRNA     03/19/2021 1110 famotidine (PEPCID) tablet 20 mg 20 mg Oral Given Danelle Berry, RN     03/19/2021 1204 fentaNYL (SUBLIMAZE) injection 25 mcg Intravenous Given Earlie Raveling, CRNA     03/19/2021 1155 fentaNYL (SUBLIMAZE) injection 25 mcg Intravenous Given Danelle Berry, CRNA     03/19/2021 1145 fentaNYL (SUBLIMAZE) injection 25 mcg Intravenous Given Danelle Berry, CRNA     03/19/2021 1141 fentaNYL (SUBLIMAZE) injection 25 mcg Intravenous Given Danelle Berry, CRNA     03/19/2021 1154 glycopyrrolate (ROBINUL) injection 0.2 mg Intravenous Given Danelle Berry, CRNA     03/19/2021 1130 lactated ringers infusion   Intravenous Restarted Danelle Berry, CRNA     03/19/2021 1129 lactated ringers infusion   Intravenous Paused Danelle Berry, CRNA Switch to gravity    03/19/2021 1123 lactated ringers infusion   Intravenous Continued from Pre-op Danelle Berry, CRNA     03/19/2021  1120 lactated ringers infusion   Intravenous New Bag/Given Earlie Raveling, RN     03/19/2021 1134 lidocaine (cardiac) 100 mg/35mL (XYLOCAINE) injection 2% 100 mg Intravenous Given Danelle Berry, CRNA     03/19/2021 1145 lidocaine (XYLOCAINE) 1 % (with pres) injection 30 mL  Given Christena Flake, MD     03/19/2021 1111 MEDLINE mouth rinse   Mouth Rinse See Alternative Earlie Raveling, RN     03/19/2021 1123 midazolam (VERSED) injection 2 mg Intravenous Given Danelle Berry, CRNA     03/19/2021 1228 ondansetron (ZOFRAN) injection 4 mg Intravenous Given Danelle Berry, CRNA     03/19/2021 1226 phenylephrine (NEO-SYNEPHRINE) injection 200 mcg Intravenous Given Danelle Berry, CRNA     03/19/2021 1214 phenylephrine (NEO-SYNEPHRINE) injection 50 mcg Intravenous Given Jeanine Luz, CRNA     03/19/2021 1134 propofol (DIPRIVAN) 10 mg/mL bolus/IV push 150 mg Intravenous Given Danelle Berry, CRNA       Mobility {Mobility:20148}

## 2021-03-19 NOTE — Transfer of Care (Signed)
Immediate Anesthesia Transfer of Care Note  Patient: Alexandra Mills  Procedure(s) Performed: RIGHT KNEE ARTHROSCOPY WITH DEBRIDEMENT AND REPAIR VERSUIS PARTIAL MEDIAL MENISCECTOMY (Right Knee)  Patient Location: PACU  Anesthesia Type:General  Level of Consciousness: sedated  Airway & Oxygen Therapy: Patient Spontanous Breathing and Patient connected to face mask oxygen  Post-op Assessment: Report given to RN and Post -op Vital signs reviewed and stable  Post vital signs: Reviewed and stable  Last Vitals:  Vitals Value Taken Time  BP 125/85 03/19/21 1245  Temp    Pulse 85 03/19/21 1246  Resp 26 03/19/21 1246  SpO2 100 % 03/19/21 1246  Vitals shown include unvalidated device data.  Last Pain:  Vitals:   03/19/21 1055  TempSrc: Temporal  PainSc: 2          Complications: No complications documented.

## 2021-03-19 NOTE — Op Note (Signed)
03/19/2021  12:43 PM  Patient:   Alexandra Mills  Pre-Op Diagnosis:   Complex medial meniscus tear with underlying degenerative joint disease, right knee.  Postoperative diagnosis:   Complex medial meniscus tear, symptomatic suprapatella plica, and degenerative joint disease, right knee.    Procedure:   Arthroscopic partial medial meniscectomy, arthroscopic debridement of symptomatic suprapatella plica, and abrasion chondroplasty of grade 3 chondromalacial changes involving the femoral trochlea and medial femoral condyle, right knee.  Surgeon:   Maryagnes Amos, MD  Assistant:   Brigid Re, PA-S  Anesthesia:   General LMA  Findings:   As above. There were grade 2-3 chondromalacial changes involving the weightbearing portion of the medial femoral condyle, grade 3 chondromalacial changes involving the medial edge of the anterior portion of the medial femoral condyle, consistent with a "kissing lesion", grade 3 chondromalacial changes involving the femoral trochlea, and grade 1-2 chondromalacial changes involving the patella. The lateral compartment was in excellent condition, as was the lateral meniscus. The anterior and posterior cruciate ligaments also both were in excellent condition.  Complications:   None  EBL:   5 cc.  Total fluids:   700 cc of crystalloid.  Tourniquet time:   None  Drains:   None  Closure:   4-0 Prolene interrupted sutures.  Brief clinical note:   The patient is a 60 year old female with a long history of anterior and medial sided right knee pain. These symptoms have worsened over the past several months despite nonsurgical treatment, including medications, activity modification, and steroid injections. Her history and examination were consistent with a medial meniscus tear with underlying degenerative joint disease, both of which were confirmed by MRI scan. The patient presents at this time for arthroscopy, debridement, and partial medial  meniscectomy.  Procedure:   The patient was brought into the operating room and lain in the supine position. After adequate general laryngeal mask anesthesia was obtained, a timeout was performed to verify the appropriate side. The patient's right knee was injected sterilely using a solution of 30 cc of 1% lidocaine and 30 cc of 0.5% Sensorcaine with epinephrine. The right lower extremity was prepped with ChloraPrep solution before being draped sterilely. Preoperative antibiotics were administered. The expected portal sites were injected with 0.5% Sensorcaine with epinephrine before the camera was placed in the anterolateral portal and instrumentation performed through the anteromedial portal.   The knee was sequentially examined beginning in the suprapatellar pouch, then progressing to the patellofemoral space, the medial gutter and compartment, the notch, and finally the lateral compartment and gutter. The findings were as described above. Abundant reactive synovial tissues anteriorly were debrided using the full-radius resector in order to improve visualization.  This debridement exposed a large thickened soup retropatellar plica which appeared to be abrading the anteromedial portion of the medial femoral condyle.  This plica was excised, removing it back to the capsule.  The medial meniscus was inspected and the complex tear involving the posterior portion of medial meniscus identified by probing. The knee was quite tight medially, so the medial collateral ligament was "pie crusted" using an 18-gauge needle to open up the medial compartment. The torn portion of the medial meniscus was debrided back to stable margins using a combination of the small baskets and full-radius resector. The remaining rim was probed and found to be stable. The areas of grade 3 chondromalacial changes involving the femoral trochlea and medial femoral condyle were debrided back to stable margins using the full-radius resector. The  instruments were removed  from the joint after suctioning the excess fluid.   The portal sites were closed using 4-0 Prolene interrupted sutures before a sterile bulky dressing was applied to the knee. The patient was then awakened, extubated, and returned to the recovery room in satisfactory condition after tolerating the procedure well.

## 2021-03-19 NOTE — Anesthesia Postprocedure Evaluation (Signed)
Anesthesia Post Note  Patient: Alexandra Mills  Procedure(s) Performed: RIGHT KNEE ARTHROSCOPY WITH DEBRIDEMENT AND REPAIR VERSUIS PARTIAL MEDIAL MENISCECTOMY (Right Knee)  Patient location during evaluation: PACU Anesthesia Type: General Level of consciousness: awake and alert Pain management: pain level controlled Vital Signs Assessment: post-procedure vital signs reviewed and stable Respiratory status: spontaneous breathing and respiratory function stable Cardiovascular status: stable Anesthetic complications: no   No complications documented.   Last Vitals:  Vitals:   03/19/21 1345 03/19/21 1404  BP: (!) 126/91 133/79  Pulse: 70 67  Resp: 18 16  Temp: (!) 36.3 C 36.9 C  SpO2: 100% 100%    Last Pain:  Vitals:   03/19/21 1404  TempSrc: Temporal  PainSc: 0-No pain                 Chas Axel K

## 2021-03-19 NOTE — Discharge Instructions (Addendum)
Orthopedic discharge instructions: Keep dressing dry and intact.  May shower after dressing changed on post-op day #4 (Saturday).  Cover sutures with Band-Aids after drying off. Apply ice frequently to knee. Take ibuprofen 600-800 mg TID with meals for 7-10 days, then as necessary.   800mg  every 8 hours Take pain medication as prescribed or ES Tylenol when needed.    Take regularly alternating with ibuprofen to prevent pain. 1000mg  every 8 hours May weight-bear as tolerated - use crutches or walker as needed. Follow-up in 10-14 days or as scheduled.  AMBULATORY SURGERY  DISCHARGE INSTRUCTIONS   1) The drugs that you were given will stay in your system until tomorrow so for the next 24 hours you should not:  A) Drive an automobile B) Make any legal decisions C) Drink any alcoholic beverage   2) You may resume regular meals tomorrow.  Today it is better to start with liquids and gradually work up to solid foods.  You may eat anything you prefer, but it is better to start with liquids, then soup and crackers, and gradually work up to solid foods.   3) Please notify your doctor immediately if you have any unusual bleeding, trouble breathing, redness and pain at the surgery site, drainage, fever, or pain not relieved by medication.    4) Additional Instructions:   Please contact your physician with any problems or Same Day Surgery at [phone], Monday through Friday 6 am to 4 pm, or [place] at [facility] number at [phone].

## 2021-03-20 ENCOUNTER — Encounter: Payer: Self-pay | Admitting: Surgery

## 2021-10-04 ENCOUNTER — Ambulatory Visit (INDEPENDENT_AMBULATORY_CARE_PROVIDER_SITE_OTHER): Payer: 59 | Admitting: Cardiology

## 2021-10-04 ENCOUNTER — Encounter: Payer: Self-pay | Admitting: Cardiology

## 2021-10-04 ENCOUNTER — Other Ambulatory Visit: Payer: Self-pay

## 2021-10-04 VITALS — BP 118/76 | HR 64 | Ht 62.0 in | Wt 184.1 lb

## 2021-10-04 DIAGNOSIS — I471 Supraventricular tachycardia: Secondary | ICD-10-CM | POA: Diagnosis not present

## 2021-10-04 NOTE — Progress Notes (Signed)
Cardiology Office Note:    Date:  10/04/2021   ID:  Alexandra Mills, DOB 09-01-1961, MRN [id number]  PCP:  Leanna Sato, MD  Cardiologist:  Debbe Odea, MD  Electrophysiologist:  None  Referring MD: Leanna Sato, MD   Chief Complaint  Patient presents with   Other    12 month f/u no complaints today. Meds reviewed verbally with pt.    History of Present Illness:    Alexandra Mills is a 60 y.o. female with history of paroxysmal SVT who presents for follow-up.  Has a history of fluttering and dizziness.  Cardiac monitor showed paroxysmal SVT being monitored off medications.    Beta-blocker trial was discussed with patient but she wanted to monitor symptoms off medications.  She feels well, still has occasional palpitations maybe once a month but symptoms are not life limiting.  She otherwise feels well, denies chest pain or shortness of breath.  Occasionally, her heart races after having a bad dream.   Past Medical History:  Diagnosis Date   Complication of anesthesia    DIFFICULT TO WAKE UP    Past Surgical History:  Procedure Laterality Date   DILATION AND CURETTAGE OF UTERUS     KNEE ARTHROSCOPY Right 03/19/2021   Procedure: RIGHT KNEE ARTHROSCOPY WITH DEBRIDEMENT AND REPAIR VERSUIS PARTIAL MEDIAL MENISCECTOMY;  Surgeon: Christena Flake, MD;  Location: [facility] ORS;  Service: Orthopedics;  Laterality: Right;   ROTATOR CUFF REPAIR Right    X2   TONSILLECTOMY      Current Medications: Current Meds  Medication Sig   Ascorbic Acid (VITAMIN C PO) Take 1 tablet by mouth daily.   B Complex-C (SUPER B-C PO) Take 1 capsule by mouth daily.   Cholecalciferol 50 MCG (2000 UT) CAPS Take 2,000 Units by mouth daily.   Evening Primrose Oil 1000 MG CAPS Take 1,000 mg by mouth daily.   ibuprofen (ADVIL) 200 MG tablet Take 200-400 mg by mouth every 6 (six) hours as needed for moderate pain.   Menthol, Topical Analgesic, (BENGAY EX) Apply 1 application topically daily as needed (knee  pain).   Multiple Vitamin (MULTI-VITAMIN) tablet Take 1 tablet by mouth daily.   Turmeric 400 MG CAPS Take 400 mg by mouth daily.     Allergies:   Mangifera indica, Aleve [naproxen], Codeine, and Pineapple   Social History   Socioeconomic History   Marital status: Widowed    Spouse name: Not on file   Number of children: Not on file   Years of education: Not on file   Highest education level: Not on file  Occupational History   Not on file  Tobacco Use   Smoking status: Never   Smokeless tobacco: Never  Vaping Use   Vaping Use: Never used  Substance and Sexual Activity   Alcohol use: Never   Drug use: Never   Sexual activity: Not on file  Other Topics Concern   Not on file  Social History Narrative   Not on file   Social Determinants of Health   Financial Resource Strain: Not on file  Food Insecurity: Not on file  Transportation Needs: Not on file  Physical Activity: Not on file  Stress: Not on file  Social Connections: Not on file     Family History: The patient's family history includes Bradycardia in her mother; Heart attack in her maternal grandmother; Stroke in her maternal grandmother.  ROS:   Please see the history of present illness.     All other  systems reviewed and are negative.  EKGs/Labs/Other Studies Reviewed:    The following studies were reviewed today:   EKG:  EKG is  ordered today.  The ekg ordered today demonstrates normal sinus rhythm, normal ekg  Recent Labs: No results found for requested labs within last 8760 hours.  Recent Lipid Panel No results found for: CHOL, TRIG, HDL, CHOLHDL, VLDL, LDLCALC, LDLDIRECT  Physical Exam:    VS:  BP 118/76 (BP Location: Left Arm, Patient Position: Sitting, Cuff Size: Normal)   Pulse 64   Ht 5\' 2"  (1.575 m)   Wt 184 lb 2 oz (83.5 kg)   SpO2 99%   BMI 33.68 kg/m     Wt Readings from Last 3 Encounters:  10/04/21 184 lb 2 oz (83.5 kg)  03/19/21 169 lb 15.6 oz (77.1 kg)  03/13/21 170 lb (77.1  kg)     GEN:  Well nourished, well developed in no acute distress HEENT: Normal NECK: No JVD; No carotid bruits LYMPHATICS: No lymphadenopathy CARDIAC: RRR, no murmurs, rubs, gallops RESPIRATORY:  Clear to auscultation without rales, wheezing or rhonchi  ABDOMEN: Soft, non-tender, non-distended MUSCULOSKELETAL:  No edema; No deformity  SKIN: Warm and dry NEUROLOGIC:  Alert and oriented x 3 PSYCHIATRIC:  Normal affect   ASSESSMENT:    1. Paroxysmal SVT (supraventricular tachycardia) (HCC)     PLAN:     Patient with a history of paroxysmal SVT, and palpitations.  Patient has very rare symptoms occurring once a month also lasting a few seconds.  Otherwise feels fine.  Continue to monitor off beta-blockers.  If symptoms become persistent or worse, will consider beta-blocker.  Follow-up yearly  This note was generated in part or whole with voice recognition software. Voice recognition is usually quite accurate but there are transcription errors that can and very often do occur. I apologize for any typographical errors that were not detected and corrected.  Medication Adjustments/Labs and Tests Ordered: Current medicines are reviewed at length with the patient today.  Concerns regarding medicines are outlined above.  Orders Placed This Encounter  Procedures   EKG 12-Lead    No orders of the defined types were placed in this encounter.   Patient Instructions  Medication Instructions:  Your physician recommends that you continue on your current medications as directed. Please refer to the Current Medication list given to you today.  *If you need a refill on your cardiac medications before your next appointment, please call your pharmacy*   Lab Work: None ordered If you have labs (blood work) drawn today and your tests are completely normal, you will receive your results only by: MyChart Message (if you have MyChart) OR A paper copy in the mail If you have any lab test that  is abnormal or we need to change your treatment, we will call you to review the results.   Testing/Procedures: None ordered   Follow-Up: At [facility], you and your health needs are our priority.  As part of our continuing mission to provide you with exceptional heart care, we have created designated Provider Care Teams.  These Care Teams include your primary Cardiologist (physician) and Advanced Practice Providers (APPs -  Physician Assistants and Nurse Practitioners) who all work together to provide you with the care you need, when you need it.  We recommend signing up for the patient portal called "MyChart".  Sign up information is provided on this After Visit Summary.  MyChart is used to connect with patients for Virtual Visits (Telemedicine).  Patients are able to view lab/test results, encounter notes, upcoming appointments, etc.  Non-urgent messages can be sent to your provider as well.   To learn more about what you can do with MyChart, go to [email or web address].    Your next appointment:   Your physician wants you to follow-up in: 1 year You will receive a reminder letter in the mail two months in advance. If you don't receive a letter, please call our office to schedule the follow-up appointment.   The format for your next appointment:   In Person  Provider:   You may see Debbe Odea, MD or one of the following Advanced Practice Providers on your designated Care Team:   Nicolasa Ducking, NP Eula Listen, PA-C Cadence Fransico Michael, [place]     Other Instructions N/A   Signed, Debbe Odea, MD  10/04/2021 5:15 PM    [facility]

## 2021-10-04 NOTE — Patient Instructions (Signed)
Medication Instructions:  ?Your physician recommends that you continue on your current medications as directed. Please refer to the Current Medication list given to you today. ? ?*If you need a refill on your cardiac medications before your next appointment, please call your pharmacy* ? ? ?Lab Work: ?None ordered ?If you have labs (blood work) drawn today and your tests are completely normal, you will receive your results only by: ?MyChart Message (if you have MyChart) OR ?A paper copy in the mail ?If you have any lab test that is abnormal or we need to change your treatment, we will call you to review the results. ? ? ?Testing/Procedures: ?None ordered ? ? ?Follow-Up: ?At [facility], you and your health needs are our priority.  As part of our continuing mission to provide you with exceptional heart care, we have created designated Provider Care Teams.  These Care Teams include your primary Cardiologist (physician) and Advanced Practice Providers (APPs -  Physician Assistants and Nurse Practitioners) who all work together to provide you with the care you need, when you need it. ? ?We recommend signing up for the patient portal called "MyChart".  Sign up information is provided on this After Visit Summary.  MyChart is used to connect with patients for Virtual Visits (Telemedicine).  Patients are able to view lab/test results, encounter notes, upcoming appointments, etc.  Non-urgent messages can be sent to your provider as well.   ?To learn more about what you can do with MyChart, go to [email or web address].   ? ?Your next appointment:   ?Your physician wants you to follow-up in: 1 year You will receive a reminder letter in the mail two months in advance. If you don't receive a letter, please call our office to schedule the follow-up appointment. ? ? ?The format for your next appointment:   ?In Person ? ?Provider:   ?You may see Brian Agbor-Etang, MD or one of the following Advanced Practice Providers on your  designated Care Team:   ?Christopher Berge, NP ?Ryan Dunn, PA-C ?Cadence Furth, PA-C{ ? ? ? ?Other Instructions ?N/A ? ?

## 2022-01-20 ENCOUNTER — Ambulatory Visit: Payer: 59 | Admitting: Dermatology

## 2022-05-02 IMAGING — MR MR KNEE*R* W/O CM
7 series · 40 of 40 positions shown · non-contrast
Comparison: None.

CLINICAL DATA: Right anterior knee pain for the past 6 weeks. No
injury or prior surgery.

EXAM:
MRI OF THE RIGHT KNEE WITHOUT CONTRAST
TECHNIQUE: Multiplanar, multisequence MR imaging of the knee was performed. No
intravenous contrast was administered.

[Series 8: T2 fat-sat · axial · right · 4.0mm · 0.50mm/px · z∈[-62,+91]mm · 5 of 32 slices shown (1 of 3)]
[im 1/32]
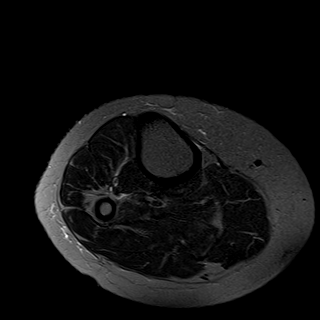
[im 8/32]
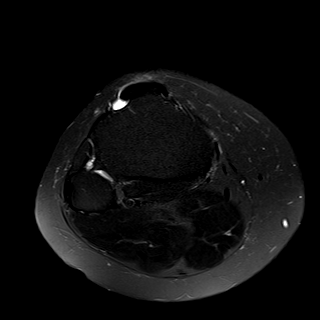
[im 16/32]
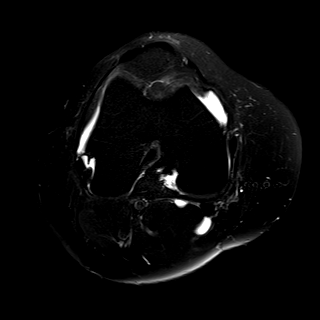
[im 24/32]
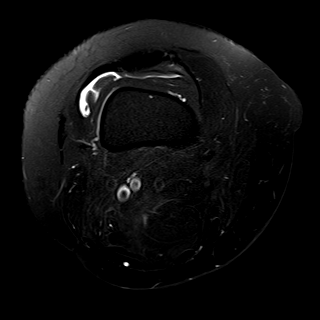
[im 32/32]
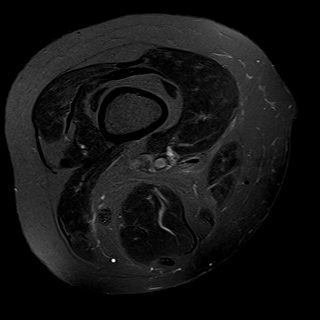

[Series 9: T2 fat-sat · coronal · right · 4.0mm · 0.59mm/px · 6 of 30 slices shown (2 of 3)]
[im 1/30]
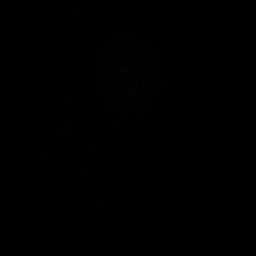
[im 6/30]
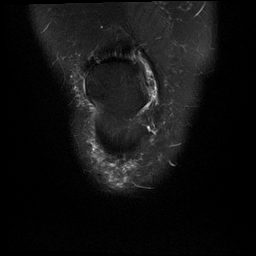
[im 12/30]
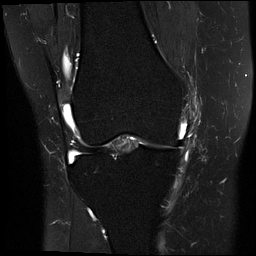
[im 18/30]
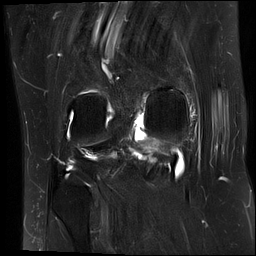
[im 24/30]
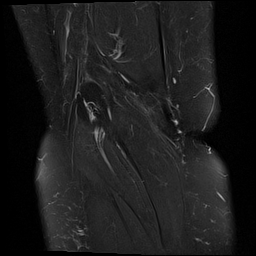
[im 30/30]
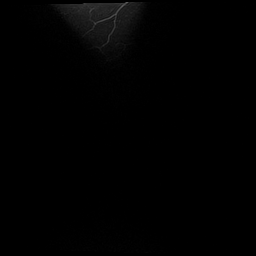

[Series 10: T1 · coronal · right · 4.0mm · 0.59mm/px · 6 of 30 slices shown]
[im 1/30]
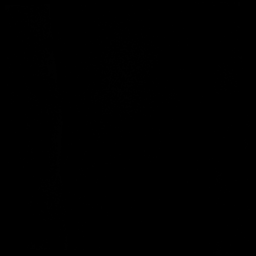
[im 6/30]
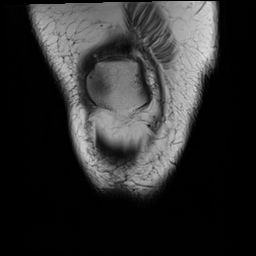
[im 12/30]
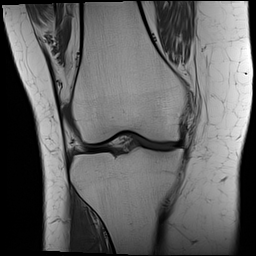
[im 18/30]
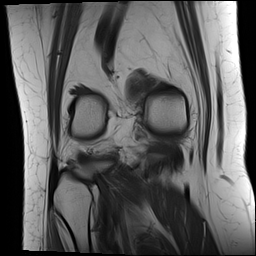
[im 24/30]
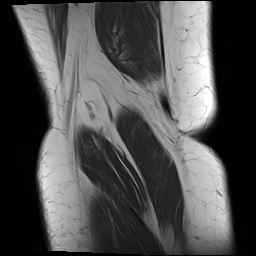
[im 30/30]
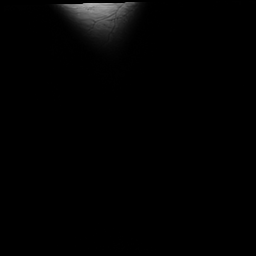

[Series 11: PD fat-sat · coronal · right · 4.0mm · 0.59mm/px · 6 of 30 slices shown (1 of 2)]
[im 1/30]
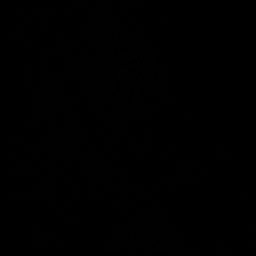
[im 6/30]
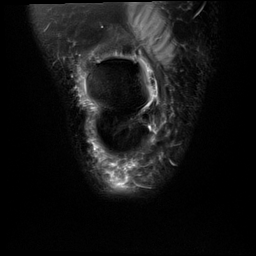
[im 12/30]
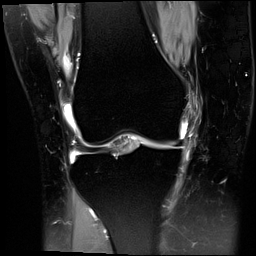
[im 18/30]
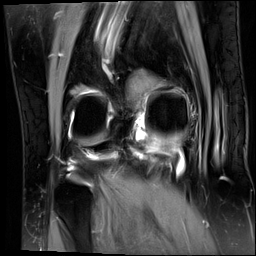
[im 24/30]
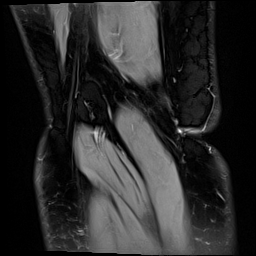
[im 30/30]
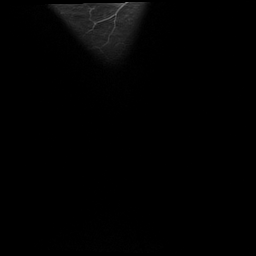

[Series 12: PD fat-sat · sagittal · right · 3.0mm · 0.59mm/px · 7 of 35 slices shown (2 of 2)]
[im 1/35]
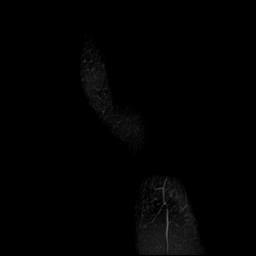
[im 6/35]
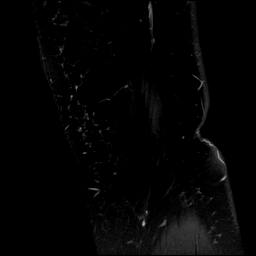
[im 12/35]
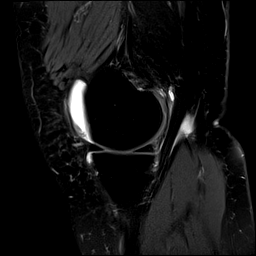
[im 18/35]
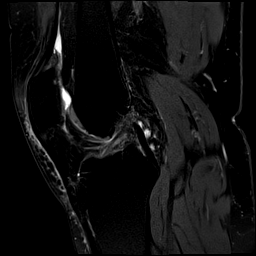
[im 23/35]
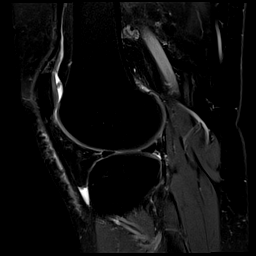
[im 29/35]
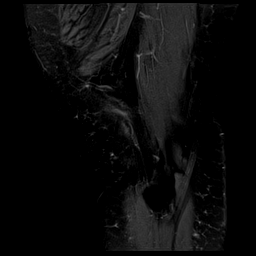
[im 35/35]
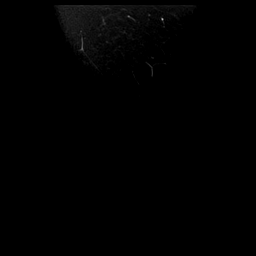

[Series 13: T2 fat-sat · sagittal · right · 3.0mm · 0.59mm/px · 7 of 36 slices shown (3 of 3)]
[im 1/36]
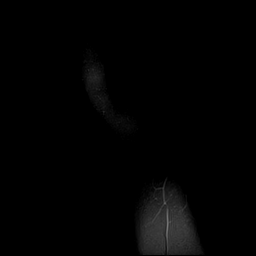
[im 6/36]
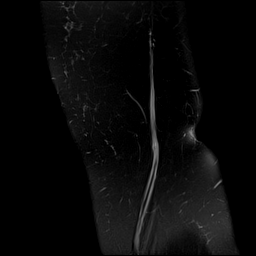
[im 12/36]
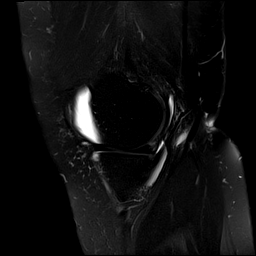
[im 18/36]
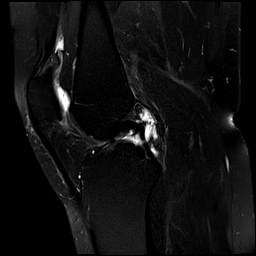
[im 24/36]
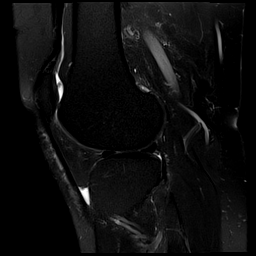
[im 30/36]
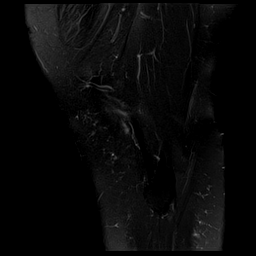
[im 36/36]
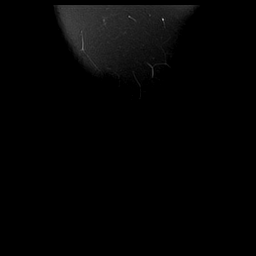

[Series 14: PD · oblique · right · 2.0mm · 0.47mm/px · 3 of 16 slices shown]
[im 1/16]
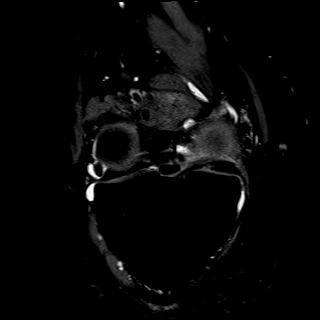
[im 8/16]
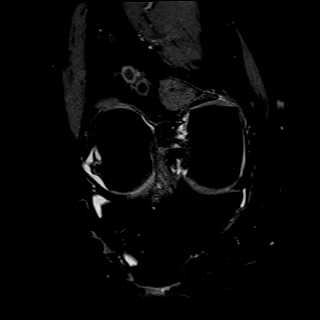
[im 16/16]
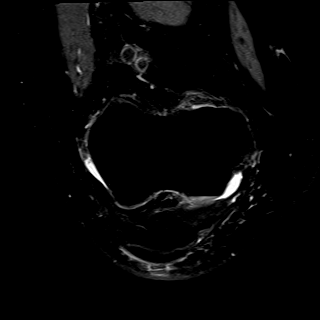

[40 of 40 positions shown; findings below may reference images not displayed]

FINDINGS: MENISCI

Medial meniscus: Degeneration and fraying of the posterior horn near
the root with small radial tear (series 12, images 22-23).

Lateral meniscus:  Intact.

LIGAMENTS

Cruciates:  Intact ACL and PCL.

Collaterals: Medial collateral ligament is intact. Lateral
collateral ligament complex is intact.

CARTILAGE

Patellofemoral: High-grade near full-thickness cartilage loss over
the medial patellar facet and trochlear groove.

Medial: Mild surface irregularity over the mesial aspect of the
medial femoral condyle. No chondral defect.

Lateral:  No chondral defect.

Joint:  Small joint effusion.  Normal Hoffa's fat.

Popliteal Fossa:  Small Baker cyst.  Intact popliteus tendon.

Extensor Mechanism: Intact quadriceps tendon and patellar tendon.
Intact medial and lateral patellar retinaculum. Intact MPFL.

Bones: No focal marrow signal abnormality. No fracture or
dislocation.

Other: Small amount of fluid in the semimembranosus-tibial
collateral ligament bursa.
IMPRESSION: 1. Degeneration and fraying of the medial meniscus posterior horn
near the root with small radial tear.
2. Mild patellofemoral compartment osteoarthritis.
3. Mild semimembranosus-tibial collateral ligament bursitis.
4. Small joint effusion and Baker cyst.

## 2022-11-18 NOTE — Progress Notes (Unsigned)
Cardiology Office Note    Date:  11/19/2022   ID:  Alexandra Mills, DOB [birth year], MRN [id number]  PCP:  Marguerita Merles, MD  Cardiologist:  Kate Sable, MD  Electrophysiologist:  None   Chief Complaint: Follow up  History of Present Illness:   Alexandra Mills is a 62 y.o. female with history of paroxysmal SVT who presents for follow-up.  She was evaluated in 2021 for tachypalpitations and dizziness.  Subsequent Zio patch in 12/2019 showed a predominant rhythm of sinus with an average heart rate of 70 bpm (range 41-141 bpm), 3 runs of SVT occurred with the fastest interval lasting 7 beats and the longest episode lasting 9 beats.  SVT was detected with symptomatic patient events.  She has not required medications.  She was last seen in the office in 09/2021 noting occasional intermittent palpitations and was without symptoms of angina or decompensation.  Doing well from a cardiac perspective and is without symptoms of angina or decompensation.  No significant dyspnea, dizziness, presyncope, or syncope.  She notes overall stable intermittent brief episodes of palpitations that will last for a couple of seconds and spontaneously resolved.  She also notes a rare, "blue moon," episode of brief split-second lasting chest discomfort that occurs at rest.  She remains very active at baseline, exercising 5 to 6 days/week without cardiac limitation or symptoms.  She also recently hiked through the rainforest in [place] without cardiac limitation and late last year spent some time in [place] with a friend who is an Academic librarian.  She was working out with this teacher for 2 days straight from 9-4 without cardiac limitation.  Overall, she feels well and would prefer to defer further testing at this time.   Labs independently reviewed: None available for review   Past Medical History:  Diagnosis Date   Complication of anesthesia    DIFFICULT TO WAKE UP    Past Surgical History:  Procedure  Laterality Date   DILATION AND CURETTAGE OF UTERUS     KNEE ARTHROSCOPY Right 03/19/2021   Procedure: RIGHT KNEE ARTHROSCOPY WITH DEBRIDEMENT AND REPAIR VERSUIS PARTIAL MEDIAL MENISCECTOMY;  Surgeon: Corky Mull, MD;  Location: [facility] ORS;  Service: Orthopedics;  Laterality: Right;   ROTATOR CUFF REPAIR Right    X2   TONSILLECTOMY      Current Medications: Current Meds  Medication Sig   B Complex-C (SUPER B-C PO) Take 1 capsule by mouth daily.   Cholecalciferol 50 MCG (2000 UT) CAPS Take 2,000 Units by mouth daily.   Evening Primrose Oil 1000 MG CAPS Take 1,000 mg by mouth daily.   ibuprofen (ADVIL) 200 MG tablet Take 200-400 mg by mouth every 6 (six) hours as needed for moderate pain.   Menthol, Topical Analgesic, (BENGAY EX) Apply 1 application topically daily as needed (knee pain).   Multiple Vitamin (MULTI-VITAMIN) tablet Take 1 tablet by mouth daily.   Turmeric 400 MG CAPS Take 400 mg by mouth daily.    Allergies:   Mangifera indica, Aleve [naproxen], Codeine, and Pineapple   Social History   Socioeconomic History   Marital status: Widowed    Spouse name: Not on file   Number of children: Not on file   Years of education: Not on file   Highest education level: Not on file  Occupational History   Not on file  Tobacco Use   Smoking status: Never   Smokeless tobacco: Never  Vaping Use   Vaping Use: Never used  Substance and  Sexual Activity   Alcohol use: Never   Drug use: Never   Sexual activity: Not on file  Other Topics Concern   Not on file  Social History Narrative   Not on file   Social Determinants of Health   Financial Resource Strain: Not on file  Food Insecurity: Not on file  Transportation Needs: Not on file  Physical Activity: Not on file  Stress: Not on file  Social Connections: Not on file     Family History:  The patient's family history includes Bradycardia in her mother; Heart attack in her maternal grandmother; Stroke in her maternal  grandmother.  ROS:   12-point review of systems is negative unless otherwise noted in the HPI.   EKGs/Labs/Other Studies Reviewed:    Studies reviewed were summarized above. The additional studies were reviewed today:  Zio patch 12/2019: Patient had a min HR of 41 bpm, max HR of 141 bpm, and avg HR of 70 bpm. Predominant underlying rhythm was Sinus Rhythm. 2 Supraventricular Tachycardia runs occurred, the run with the fastest interval lasting 7 beats with a max rate of 122 bpm. Supraventricular Tachycardia was detected within +/- 45 seconds of symptomatic patient event(s). Isolated SVEs were rare (<1.0%).   EKG:  EKG is ordered today.  The EKG ordered today demonstrates NSR, 65 bpm, no acute ST-T changes  Recent Labs: No results found for requested labs within last 365 days.  Recent Lipid Panel No results found for: "CHOL", "TRIG", "HDL", "CHOLHDL", "VLDL", "LDLCALC", "LDLDIRECT"  PHYSICAL EXAM:    VS:  BP (!) 134/90 (BP Location: Left Arm, Patient Position: Sitting, Cuff Size: Large)   Pulse 65   Ht 5\' 2"  (1.575 m)   Wt 195 lb 6 oz (88.6 kg)   SpO2 98%   BMI 35.73 kg/m   BMI: Body mass index is 35.73 kg/m.  Physical Exam Vitals reviewed.  Constitutional:      Appearance: She is well-developed.  HENT:     Head: Normocephalic and atraumatic.  Eyes:     General:        Right eye: No discharge.        Left eye: No discharge.  Neck:     Vascular: No JVD.  Cardiovascular:     Rate and Rhythm: Normal rate and regular rhythm.     Heart sounds: Normal heart sounds, S1 normal and S2 normal. Heart sounds not distant. No midsystolic click and no opening snap. No murmur heard.    No friction rub.  Pulmonary:     Effort: Pulmonary effort is normal. No respiratory distress.     Breath sounds: Normal breath sounds. No decreased breath sounds, wheezing or rales.  Chest:     Chest wall: No tenderness.  Abdominal:     General: There is no distension.  Musculoskeletal:      Cervical back: Normal range of motion.     Right lower leg: No edema.     Left lower leg: No edema.  Skin:    General: Skin is warm and dry.     Nails: There is no clubbing.  Neurological:     Mental Status: She is alert and oriented to person, place, and time.  Psychiatric:        Speech: Speech normal.        Behavior: Behavior normal.        Thought Content: Thought content normal.        Judgment: Judgment normal.     Wt Readings  from Last 3 Encounters:  11/19/22 195 lb 6 oz (88.6 kg)  10/04/21 184 lb 2 oz (83.5 kg)  03/19/21 169 lb 15.6 oz (77.1 kg)     ASSESSMENT & PLAN:   PSVT: Symptoms are overall stable.  Prior outpatient cardiac monitoring showed 2 episodes of PSVT lasting 7 and 9 beats each.  Not currently requiring as needed or standing AV nodal blocking medication.  Continue to monitor.  Chest discomfort appears atypical in etiology, occurring at rest and lasting for a split-second in duration.  We did discuss further risk stratification with a coronary CTA, she would prefer to defer this at this time and will let us know if symptoms progress.   Disposition: F/u with Dr. Garen Lah or an APP in 12 months.   Medication Adjustments/Labs and Tests Ordered: Current medicines are reviewed at length with the patient today.  Concerns regarding medicines are outlined above. Medication changes, Labs and Tests ordered today are summarized above and listed in the Patient Instructions accessible in Encounters.   Signed, Christell Faith, PA-C 11/19/2022 12:06 PM     [address], [place] [address] [phone]

## 2022-11-19 ENCOUNTER — Encounter: Payer: Self-pay | Admitting: Physician Assistant

## 2022-11-19 ENCOUNTER — Ambulatory Visit: Payer: Self-pay | Attending: Physician Assistant | Admitting: Physician Assistant

## 2022-11-19 VITALS — BP 134/90 | HR 65 | Ht 62.0 in | Wt 195.4 lb

## 2022-11-19 DIAGNOSIS — I471 Supraventricular tachycardia, unspecified: Secondary | ICD-10-CM

## 2022-11-19 NOTE — Patient Instructions (Signed)
Medication Instructions:  No changes at this time.   *If you need a refill on your cardiac medications before your next appointment, please call your pharmacy*   Lab Work: None  If you have labs (blood work) drawn today and your tests are completely normal, you will receive your results only by: [place] (if you have MyChart) OR A paper copy in the mail If you have any lab test that is abnormal or we need to change your treatment, we will call you to review the results.   Testing/Procedures: None   Follow-Up: At [facility], you and your health needs are our priority.  As part of our continuing mission to provide you with exceptional heart care, we have created designated Provider Care Teams.  These Care Teams include your primary Cardiologist (physician) and Advanced Practice Providers (APPs -  Physician Assistants and Nurse Practitioners) who all work together to provide you with the care you need, when you need it.  Your next appointment:   1 year(s)  The format for your next appointment:   In Person  Provider:   Kate Sable, MD or Christell Faith, PA-C      Important Information About Sugar

## 2023-04-07 ENCOUNTER — Telehealth: Payer: Self-pay | Admitting: Cardiology

## 2023-04-07 ENCOUNTER — Telehealth: Payer: Self-pay

## 2023-04-07 NOTE — Telephone Encounter (Signed)
   Name: Alexandra Mills  DOB: 02-15-1961  MRN: [id number]  Primary Cardiologist: Debbe Odea, MD   Preoperative team, please contact this patient and set up a phone call appointment for further preoperative risk assessment. Please obtain consent and complete medication review. Thank you for your help.  I confirm that guidance regarding antiplatelet and oral anticoagulation therapy has been completed and, if necessary, noted below (none requested).    Joylene Grapes, NP 04/07/2023, 11:48 AM [facility]

## 2023-04-07 NOTE — Telephone Encounter (Signed)
  Patient Consent for Virtual Visit        [name] has provided verbal consent on 04/07/2023 for a virtual visit (video or telephone).   CONSENT FOR VIRTUAL VISIT FOR:  Alexandra Mills  By participating in this virtual visit I agree to the following:  I hereby voluntarily request, consent and authorize [facility] and its employed or contracted physicians, physician assistants, nurse practitioners or other licensed health care professionals (the Practitioner), to provide me with telemedicine health care services (the "Services") as deemed necessary by the treating Practitioner. I acknowledge and consent to receive the Services by the Practitioner via telemedicine. I understand that the telemedicine visit will involve communicating with the Practitioner through live audiovisual communication technology and the disclosure of certain medical information by electronic transmission. I acknowledge that I have been given the opportunity to request an in-person assessment or other available alternative prior to the telemedicine visit and am voluntarily participating in the telemedicine visit.  I understand that I have the right to withhold or withdraw my consent to the use of telemedicine in the course of my care at any time, without affecting my right to future care or treatment, and that the Practitioner or I may terminate the telemedicine visit at any time. I understand that I have the right to inspect all information obtained and/or recorded in the course of the telemedicine visit and may receive copies of available information for a reasonable fee.  I understand that some of the potential risks of receiving the Services via telemedicine include:  Delay or interruption in medical evaluation due to technological equipment failure or disruption; Information transmitted may not be sufficient (e.g. poor resolution of images) to allow for appropriate medical decision making by the Practitioner;  and/or  In rare instances, security protocols could fail, causing a breach of personal health information.  Furthermore, I acknowledge that it is my responsibility to provide information about my medical history, conditions and care that is complete and accurate to the best of my ability. I acknowledge that Practitioner's advice, recommendations, and/or decision may be based on factors not within their control, such as incomplete or inaccurate data provided by me or distortions of diagnostic images or specimens that may result from electronic transmissions. I understand that the practice of medicine is not an exact science and that Practitioner makes no warranties or guarantees regarding treatment outcomes. I acknowledge that a copy of this consent can be made available to me via my patient portal [facility] MyChart), or I can request a printed copy by calling the office of [facility].    I understand that my insurance will be billed for this visit.   I have read or had this consent read to me. I understand the contents of this consent, which adequately explains the benefits and risks of the Services being provided via telemedicine.  I have been provided ample opportunity to ask questions regarding this consent and the Services and have had my questions answered to my satisfaction. I give my informed consent for the services to be provided through the use of telemedicine in my medical care

## 2023-04-07 NOTE — Telephone Encounter (Signed)
   Pre-operative Risk Assessment    Patient Name: Alexandra Mills  DOB: [birth year] MRN: [id number]      Request for Surgical Clearance    Procedure:   COLONOSCOPY ery:  Clearance 04/20/23                                Surgeon:  DR DOSHI Surgeon's Group or Practice Name:  [facility] Phone number:  [phone] Fax number:  [phone]  Type of Clearance Requested:   - Pharmacy:  Hold NOT INDICATED     Type of Anesthesia:  Not Indicated   Additional requests/questions:    SignedDalia Heading   04/07/2023, 10:24 AM

## 2023-04-07 NOTE — Telephone Encounter (Signed)
Spoke with patient who is agreeable to do a tele visit on 5/28 at 3 pm. Med rec and consent done.

## 2023-04-09 NOTE — Telephone Encounter (Signed)
I have d/w the pt the reasoning for the tele appt. Pt said the person she s/w yesterday spoke so fast she was unsure of what the appt was for and just needed clarification. Once I went over the protocol pt was agreeable to plan of care and said let's keep the tele appt. Pt was grateful for my help today.

## 2023-04-09 NOTE — Telephone Encounter (Signed)
Patient is calling back to get further information as to why a tele appt is needed when she just seen doctor recently. She states she doesn't have insurance and received an estimate of cost and would like clarification as to why an appt is actually needed. Please advise.

## 2023-04-14 ENCOUNTER — Ambulatory Visit: Payer: Medicaid Other | Attending: Cardiology

## 2023-04-14 DIAGNOSIS — Z01810 Encounter for preprocedural cardiovascular examination: Secondary | ICD-10-CM

## 2023-04-14 NOTE — Progress Notes (Signed)
Virtual Visit via Telephone Note   Because of Katianne Choplin's co-morbid illnesses, she is at least at moderate risk for complications without adequate follow up.  This format is felt to be most appropriate for this patient at this time.  The patient did not have access to video technology/had technical difficulties with video requiring transitioning to audio format only (telephone).  All issues noted in this document were discussed and addressed.  No physical exam could be performed with this format.  Please refer to the patient's chart for her consent to telehealth for [facility].  Evaluation Performed:  Preoperative cardiovascular risk assessment _____________   Date:  04/14/2023   Patient ID:  Alexandra Mills, DOB 1961-09-30, MRN [id number] Patient Location:  Home Provider location:   Office  Primary Care Provider:  Leanna Sato, MD Primary Cardiologist:  Debbe Odea, MD  Chief Complaint / Patient Profile   62 y.o. y/o female with a h/o paroxysmal SVT who is pending colonoscopy and presents today for telephonic preoperative cardiovascular risk assessment.  History of Present Illness    Alexandra Mills is a 62 y.o. female who presents via audio/video conferencing for a telehealth visit today.  Pt was last seen in cardiology clinic on 11/19/2022 by Eula Listen, PA.  At that time Tully Boakye was doing well with no new cardiac complaints.  She reported brief episodes of spontaneous palpitations and rare episodes of split-second chest discomfort. The patient is now pending procedure as outlined above. Since her last visit, she reports doing well with no new cardiac complaints.  She endorses ongoing occasional palpitations that are not debilitating and cause no discomfort.  She is leading a very active lifestyle and walks 3 miles per day and lifts kettle bell weights.  She denies chest pain, shortness of breath, lower extremity edema, fatigue, palpitations, melena, hematuria,  hemoptysis, diaphoresis, weakness, presyncope, syncope, orthopnea, and PND.    Past Medical History    Past Medical History:  Diagnosis Date   Complication of anesthesia    DIFFICULT TO WAKE UP   Past Surgical History:  Procedure Laterality Date   DILATION AND CURETTAGE OF UTERUS     KNEE ARTHROSCOPY Right 03/19/2021   Procedure: RIGHT KNEE ARTHROSCOPY WITH DEBRIDEMENT AND REPAIR VERSUIS PARTIAL MEDIAL MENISCECTOMY;  Surgeon: Christena Flake, MD;  Location: [facility] ORS;  Service: Orthopedics;  Laterality: Right;   ROTATOR CUFF REPAIR Right    X2   TONSILLECTOMY      Allergies  Allergies  Allergen Reactions   Mangifera Indica Anaphylaxis    MANGO    Aleve [Naproxen] Itching   Codeine Itching   Pineapple Swelling    Lip swelling    Home Medications    Prior to Admission medications   Medication Sig Start Date End Date Taking? Authorizing Provider  B Complex-C (SUPER B-C PO) Take 1 capsule by mouth daily.    [provider]  Cholecalciferol 50 MCG (2000 UT) CAPS Take 2,000 Units by mouth daily.    [provider]  Evening Primrose Oil 1000 MG CAPS Take 1,000 mg by mouth daily.    [provider]  ibuprofen (ADVIL) 200 MG tablet Take 200-400 mg by mouth every 6 (six) hours as needed for moderate pain.    [provider]  Menthol, Topical Analgesic, (BENGAY EX) Apply 1 application topically daily as needed (knee pain).    [provider]  Multiple Vitamin (MULTI-VITAMIN) tablet Take 1 tablet by mouth daily.    [provider]  Turmeric 400 MG CAPS Take 400 mg by mouth daily. 09/27/19   [provider]    Physical Exam    Vital Signs:  Nayelli Rindels does not have vital signs available for review today.  Given telephonic nature of communication, physical exam is limited. AAOx3. NAD. Normal affect.  Speech and respirations are unlabored.  Accessory Clinical Findings    None  Assessment & Plan    1.  Preoperative  Cardiovascular Risk Assessment:  Patient's RCRI score is 0.4% The patient affirms she has been doing well without any new cardiac symptoms. They are able to achieve 7 METS without cardiac limitations. Therefore, based on ACC/AHA guidelines, the patient would be at acceptable risk for the planned procedure without further cardiovascular testing. The patient was advised that if she develops new symptoms prior to surgery to contact our office to arrange for a follow-up visit, and she verbalized understanding.   The patient was advised that if she develops new symptoms prior to surgery to contact our office to arrange for a follow-up visit, and she verbalized understanding.  None requested  A copy of this note will be routed to requesting surgeon.  Time:   Today, I have spent 7 minutes with the patient with telehealth technology discussing medical history, symptoms, and management plan.     Napoleon Form, Leodis Rains, NP  04/14/2023, 8:14 AM

## 2023-06-16 ENCOUNTER — Inpatient Hospital Stay
Admission: RE | Admit: 2023-06-16 | Discharge: 2023-06-16 | Disposition: A | Discharge status: Home / Self Care | Payer: Self-pay | Source: Ambulatory Visit | Attending: Family Medicine | Admitting: Family Medicine

## 2023-06-16 ENCOUNTER — Other Ambulatory Visit: Payer: Self-pay | Admitting: *Deleted

## 2023-06-16 ENCOUNTER — Other Ambulatory Visit: Payer: Self-pay

## 2023-06-16 DIAGNOSIS — Z1231 Encounter for screening mammogram for malignant neoplasm of breast: Secondary | ICD-10-CM

## 2023-08-18 ENCOUNTER — Ambulatory Visit: Payer: Medicaid Other

## 2023-10-02 ENCOUNTER — Other Ambulatory Visit: Payer: Self-pay | Admitting: Family Medicine

## 2023-10-02 DIAGNOSIS — Z1231 Encounter for screening mammogram for malignant neoplasm of breast: Secondary | ICD-10-CM

## 2023-10-12 ENCOUNTER — Ambulatory Visit: Payer: Medicaid Other

## 2023-10-20 ENCOUNTER — Ambulatory Visit
Admission: RE | Admit: 2023-10-20 | Discharge: 2023-10-20 | Disposition: A | Discharge status: Home / Self Care | Payer: Medicaid Other | Source: Ambulatory Visit | Attending: Family Medicine | Admitting: Family Medicine

## 2023-10-20 DIAGNOSIS — Z1231 Encounter for screening mammogram for malignant neoplasm of breast: Secondary | ICD-10-CM | POA: Diagnosis present

## 2023-12-24 ENCOUNTER — Encounter: Payer: Self-pay | Admitting: *Deleted

## 2023-12-25 ENCOUNTER — Encounter: Payer: Self-pay | Admitting: Medical

## 2023-12-25 ENCOUNTER — Ambulatory Visit: Payer: Medicaid Other | Attending: Medical | Admitting: Medical

## 2023-12-25 VITALS — BP 122/80 | HR 81 | Ht 62.0 in | Wt 195.0 lb

## 2023-12-25 DIAGNOSIS — Z01810 Encounter for preprocedural cardiovascular examination: Secondary | ICD-10-CM | POA: Diagnosis not present

## 2023-12-25 DIAGNOSIS — I471 Supraventricular tachycardia, unspecified: Secondary | ICD-10-CM

## 2023-12-25 NOTE — Patient Instructions (Signed)
 Medication Instructions:  Your physician recommends that you continue on your current medications as directed. Please refer to the Current Medication list given to you today.   *If you need a refill on your cardiac medications before your next appointment, please call your pharmacy*   Lab Work: No labs ordered today .   Testing/Procedures: No test ordered today    Follow-Up: At [facility], you and your health needs are our priority.  As part of our continuing mission to provide you with exceptional heart care, we have created designated Provider Care Teams.  These Care Teams include your primary Cardiologist (physician) and Advanced Practice Providers (APPs -  Physician Assistants and Nurse Practitioners) who all work together to provide you with the care you need, when you need it.  We recommend signing up for the patient portal called MyChart.  Sign up information is provided on this After Visit Summary.  MyChart is used to connect with patients for Virtual Visits (Telemedicine).  Patients are able to view lab/test results, encounter notes, upcoming appointments, etc.  Non-urgent messages can be sent to your provider as well.   To learn more about what you can do with MyChart, go to [email or web address].    Your next appointment:   As needed  Provider:   You may see Redell Cave, MD or one of the following Advanced Practice Providers on your designated Care Team:   Lonni Meager, NP Bernardino Bring, PA-C Cadence Franchester, PA-C Tylene Lunch, NP Barnie Hila, NP

## 2023-12-25 NOTE — Progress Notes (Signed)
  Cardiology Office Note:  .   Date:  12/25/2023  ID:  Alexandra Mills, DOB December 29, 1960, MRN [id number] PCP: Buren Rock HERO, MD  [facility] Providers Cardiologist:  Redell Cave, MD {    History of Present Illness: .   Alexandra Mills is a 63 y.o. female with a h/o pSVT who presents for follow-up.   The patient was evaluated in 2021 for tachypalpitations and dizziness. Subsequent heart monitor in 12/2019 showed NSR with an average HR of 70bpm, the longest episode lasting 9 beats. SVT was detected with symptomatic patient events. She has not required medications. She was last seen in the office in 09/2021 noting occasional intermittent palpitations and was without symptoms of angina or decompensation.   The patient was last seen 03/2023 via tele visit for pre-op cardiac evaluation. No further cardiac work-up was recommended.   Today, the patient reports she is overall doing well. She has random heart racing and palpitations. May occur once every couple weeks. No precipitating factors.  She takes deep breaths and the heart racing goes away. She works out 5-6 days a week. Walks 3-4 miles a couple times a week. She has been doing weights. She reports brief chest pain with palpitations. No lower leg edema. She has occasional lightheadedness and dizziness related to low blood sugar.   Studies Reviewed: .       Heart monitor 01/2020 Patient had a min HR of 41 bpm, max HR of 141 bpm, and avg HR of 70 bpm. Predominant underlying rhythm was Sinus Rhythm. 2 Supraventricular Tachycardia runs occurred, the run with the fastest interval lasting 7 beats with a max rate of 122 bpm. Supraventricular Tachycardia was detected within +/- 45 seconds of symptomatic patient event(s). Isolated SVEs were rare (<1.0%),         Physical Exam:   VS:  BP 122/80 (BP Location: Left Arm, Patient Position: Sitting, Cuff Size: Normal)   Pulse 81   Ht 5' 2 (1.575 m)   Wt 195 lb (88.5 kg)   SpO2 98%   BMI 35.67  kg/m    Wt Readings from Last 3 Encounters:  12/25/23 195 lb (88.5 kg)  11/19/22 195 lb 6 oz (88.6 kg)  10/04/21 184 lb 2 oz (83.5 kg)    GEN: Well nourished, well developed in no acute distress NECK: No JVD; No carotid bruits CARDIAC: RRR, no murmurs, rubs, gallops RESPIRATORY:  Clear to auscultation without rales, wheezing or rhonchi  ABDOMEN: Soft, non-tender, non-distended EXTREMITIES:  No edema; No deformity   ASSESSMENT AND PLAN: .    pSVT Prior heart monitor in 2021 showed pSVT, fastest interval lasted 7 beats. She reports rare episodes of palpitations and brief chest pain. She is not on AV nodal blocking agents. She exercises multiple times a week and has no palpitations, chest pain or SOB with this. Discussed Cardiac CTA to risk stratify, but concern insurance will not cover this. PCP does yearly labs. We can follow-up PRN.   Dispo: Follow-up PRN  Signed, Ames Hoban VEAR Fishman, PA-C
